# Patient Record
Sex: Female | Born: 1965 | Hispanic: No | Marital: Married | Smoking: Former smoker
Health system: Southern US, Community
[De-identification: ages and names within clinical notes are randomized; demographics above are authoritative.]

## PROBLEM LIST (undated history)

## (undated) DIAGNOSIS — R102 Pelvic and perineal pain: Secondary | ICD-10-CM

## (undated) DIAGNOSIS — N938 Other specified abnormal uterine and vaginal bleeding: Secondary | ICD-10-CM

## (undated) DIAGNOSIS — Z973 Presence of spectacles and contact lenses: Secondary | ICD-10-CM

## (undated) HISTORY — PX: DILATION AND CURETTAGE OF UTERUS: SHX78

## (undated) HISTORY — DX: Other specified abnormal uterine and vaginal bleeding: N93.8

## (undated) HISTORY — PX: ABDOMINAL SURGERY: SHX537

## (undated) HISTORY — PX: TUBAL LIGATION: SHX77

## (undated) HISTORY — DX: Pelvic and perineal pain: R10.2

---

## 2008-06-15 ENCOUNTER — Emergency Department (HOSPITAL_COMMUNITY): Admission: EM | Admit: 2008-06-15 | Discharge: 2008-06-15 | Payer: Self-pay | Admitting: Emergency Medicine

## 2008-06-17 ENCOUNTER — Emergency Department (HOSPITAL_COMMUNITY): Admission: EM | Admit: 2008-06-17 | Discharge: 2008-06-17 | Payer: Self-pay | Admitting: Emergency Medicine

## 2008-07-15 ENCOUNTER — Emergency Department (HOSPITAL_COMMUNITY): Admission: EM | Admit: 2008-07-15 | Discharge: 2008-07-15 | Payer: Self-pay | Admitting: Emergency Medicine

## 2008-07-23 ENCOUNTER — Emergency Department (HOSPITAL_COMMUNITY): Admission: EM | Admit: 2008-07-23 | Discharge: 2008-07-23 | Payer: Self-pay | Admitting: Family Medicine

## 2008-07-23 ENCOUNTER — Ambulatory Visit: Payer: Self-pay | Admitting: Gynecology

## 2008-07-25 HISTORY — PX: ENDOMETRIAL ABLATION: SHX621

## 2008-08-04 ENCOUNTER — Ambulatory Visit: Payer: Self-pay | Admitting: Gynecology

## 2008-08-13 ENCOUNTER — Encounter: Payer: Self-pay | Admitting: Gynecology

## 2008-08-13 ENCOUNTER — Ambulatory Visit: Payer: Self-pay | Admitting: Gynecology

## 2008-08-13 ENCOUNTER — Ambulatory Visit (HOSPITAL_COMMUNITY): Admission: RE | Admit: 2008-08-13 | Discharge: 2008-08-13 | Payer: Self-pay | Admitting: Gynecology

## 2008-08-13 ENCOUNTER — Encounter (INDEPENDENT_AMBULATORY_CARE_PROVIDER_SITE_OTHER): Payer: Self-pay | Admitting: Nurse Practitioner

## 2008-08-13 DIAGNOSIS — Z9889 Other specified postprocedural states: Secondary | ICD-10-CM

## 2008-08-19 ENCOUNTER — Ambulatory Visit: Payer: Self-pay | Admitting: Gynecology

## 2008-08-26 ENCOUNTER — Ambulatory Visit: Payer: Self-pay | Admitting: Gynecology

## 2008-09-15 ENCOUNTER — Ambulatory Visit: Payer: Self-pay | Admitting: Gynecology

## 2008-10-17 ENCOUNTER — Ambulatory Visit: Payer: Self-pay | Admitting: Gynecology

## 2008-10-17 ENCOUNTER — Encounter: Payer: Self-pay | Admitting: Gynecology

## 2008-10-17 ENCOUNTER — Other Ambulatory Visit: Admission: RE | Admit: 2008-10-17 | Discharge: 2008-10-17 | Payer: Self-pay | Admitting: Gynecology

## 2008-12-01 ENCOUNTER — Ambulatory Visit: Payer: Self-pay | Admitting: Nurse Practitioner

## 2008-12-01 DIAGNOSIS — M2550 Pain in unspecified joint: Secondary | ICD-10-CM | POA: Insufficient documentation

## 2008-12-01 DIAGNOSIS — L259 Unspecified contact dermatitis, unspecified cause: Secondary | ICD-10-CM | POA: Insufficient documentation

## 2008-12-01 LAB — CONVERTED CEMR LAB
ALT: 16 units/L (ref 0–35)
AST: 20 units/L (ref 0–37)
Albumin: 4.6 g/dL (ref 3.5–5.2)
Alkaline Phosphatase: 77 units/L (ref 39–117)
Anti Nuclear Antibody(ANA): NEGATIVE
BUN: 7 mg/dL (ref 6–23)
Basophils Absolute: 0 10*3/uL (ref 0.0–0.1)
Basophils Relative: 0 % (ref 0–1)
CO2: 21 meq/L (ref 19–32)
Calcium: 9.9 mg/dL (ref 8.4–10.5)
Chloride: 105 meq/L (ref 96–112)
Creatinine, Ser: 0.56 mg/dL (ref 0.40–1.20)
Eosinophils Absolute: 0.3 10*3/uL (ref 0.0–0.7)
Eosinophils Relative: 3 % (ref 0–5)
Glucose, Bld: 81 mg/dL (ref 70–99)
HCT: 43.1 % (ref 36.0–46.0)
Hemoglobin: 14.7 g/dL (ref 12.0–15.0)
Lymphocytes Relative: 23 % (ref 12–46)
Lymphs Abs: 2.3 10*3/uL (ref 0.7–4.0)
MCHC: 34.1 g/dL (ref 30.0–36.0)
MCV: 87.6 fL (ref 78.0–100.0)
Monocytes Absolute: 0.5 10*3/uL (ref 0.1–1.0)
Monocytes Relative: 5 % (ref 3–12)
Neutro Abs: 6.6 10*3/uL (ref 1.7–7.7)
Neutrophils Relative %: 68 % (ref 43–77)
Platelets: 175 10*3/uL (ref 150–400)
Potassium: 4 meq/L (ref 3.5–5.3)
RBC: 4.92 M/uL (ref 3.87–5.11)
RDW: 12.7 % (ref 11.5–15.5)
Rheumatoid fact SerPl-aCnc: <20 intl units/mL (ref 0–20)
Sed Rate: 8 mm/hr (ref 0–22)
Sodium: 138 meq/L (ref 135–145)
TSH: 1.387 microintl units/mL (ref 0.350–4.500)
Total Bilirubin: 0.6 mg/dL (ref 0.3–1.2)
Total Protein: 7.9 g/dL (ref 6.0–8.3)
Uric Acid, Serum: 3.4 mg/dL (ref 2.4–7.0)
WBC: 9.7 10*3/uL (ref 4.0–10.5)

## 2008-12-08 ENCOUNTER — Ambulatory Visit: Payer: Self-pay | Admitting: Nurse Practitioner

## 2008-12-08 DIAGNOSIS — M545 Low back pain: Secondary | ICD-10-CM

## 2009-01-01 ENCOUNTER — Ambulatory Visit: Payer: Self-pay | Admitting: Nurse Practitioner

## 2009-01-01 ENCOUNTER — Other Ambulatory Visit: Admission: RE | Admit: 2009-01-01 | Discharge: 2009-01-01 | Payer: Self-pay | Admitting: Internal Medicine

## 2009-01-01 ENCOUNTER — Encounter (INDEPENDENT_AMBULATORY_CARE_PROVIDER_SITE_OTHER): Payer: Self-pay | Admitting: Nurse Practitioner

## 2009-01-01 DIAGNOSIS — R5381 Other malaise: Secondary | ICD-10-CM

## 2009-01-01 DIAGNOSIS — R5383 Other fatigue: Secondary | ICD-10-CM

## 2009-01-01 LAB — CONVERTED CEMR LAB
Bilirubin Urine: NEGATIVE
Glucose, Urine, Semiquant: NEGATIVE
KOH Prep: NEGATIVE
Ketones, urine, test strip: NEGATIVE
Nitrite: NEGATIVE
Protein, U semiquant: NEGATIVE
Specific Gravity, Urine: <1.005
Urobilinogen, UA: 0.2
WBC Urine, dipstick: NEGATIVE
pH: 6.5

## 2009-01-02 ENCOUNTER — Encounter (INDEPENDENT_AMBULATORY_CARE_PROVIDER_SITE_OTHER): Payer: Self-pay | Admitting: Nurse Practitioner

## 2009-01-05 ENCOUNTER — Ambulatory Visit (HOSPITAL_COMMUNITY): Admission: RE | Admit: 2009-01-05 | Discharge: 2009-01-05 | Payer: Self-pay | Admitting: Internal Medicine

## 2009-01-06 DIAGNOSIS — E559 Vitamin D deficiency, unspecified: Secondary | ICD-10-CM | POA: Insufficient documentation

## 2009-01-06 LAB — CONVERTED CEMR LAB
Chlamydia, DNA Probe: NEGATIVE
Cholesterol: 206 mg/dL — ABNORMAL HIGH (ref 0–200)
GC Probe Amp, Genital: NEGATIVE
HDL: 48 mg/dL (ref >39)
LDL Cholesterol: 139 mg/dL — ABNORMAL HIGH (ref 0–99)
Total CHOL/HDL Ratio: 4.3
Triglycerides: 95 mg/dL (ref <150)
VLDL: 19 mg/dL (ref 0–40)
Vit D, 25-Hydroxy: 16 ng/mL — ABNORMAL LOW (ref 30–89)

## 2009-01-08 ENCOUNTER — Encounter (INDEPENDENT_AMBULATORY_CARE_PROVIDER_SITE_OTHER): Payer: Self-pay | Admitting: Nurse Practitioner

## 2009-01-29 ENCOUNTER — Ambulatory Visit: Payer: Self-pay | Admitting: Nurse Practitioner

## 2009-01-29 LAB — CONVERTED CEMR LAB
Basophils Absolute: 0 10*3/uL (ref 0.0–0.1)
Basophils Relative: 1 % (ref 0–1)
Eosinophils Absolute: 0.3 10*3/uL (ref 0.0–0.7)
Eosinophils Relative: 5 % (ref 0–5)
HCT: 39.5 % (ref 36.0–46.0)
Hemoglobin: 12.8 g/dL (ref 12.0–15.0)
Lymphocytes Relative: 21 % (ref 12–46)
Lymphs Abs: 1.6 10*3/uL (ref 0.7–4.0)
MCHC: 32.4 g/dL (ref 30.0–36.0)
MCV: 91 fL (ref 78.0–100.0)
Monocytes Absolute: 0.5 10*3/uL (ref 0.1–1.0)
Monocytes Relative: 6 % (ref 3–12)
Neutro Abs: 5.1 10*3/uL (ref 1.7–7.7)
Neutrophils Relative %: 68 % (ref 43–77)
Platelets: 205 10*3/uL (ref 150–400)
RBC: 4.34 M/uL (ref 3.87–5.11)
RDW: 13.2 % (ref 11.5–15.5)
WBC: 7.5 10*3/uL (ref 4.0–10.5)

## 2009-02-10 ENCOUNTER — Encounter (INDEPENDENT_AMBULATORY_CARE_PROVIDER_SITE_OTHER): Payer: Self-pay | Admitting: Nurse Practitioner

## 2009-03-13 ENCOUNTER — Emergency Department (HOSPITAL_COMMUNITY): Admission: EM | Admit: 2009-03-13 | Discharge: 2009-03-13 | Payer: Self-pay | Admitting: Emergency Medicine

## 2009-03-13 ENCOUNTER — Inpatient Hospital Stay (HOSPITAL_COMMUNITY): Admission: AD | Admit: 2009-03-13 | Discharge: 2009-03-14 | Payer: Self-pay | Admitting: Obstetrics & Gynecology

## 2009-03-19 ENCOUNTER — Ambulatory Visit: Payer: Self-pay | Admitting: Obstetrics and Gynecology

## 2009-03-22 ENCOUNTER — Encounter (INDEPENDENT_AMBULATORY_CARE_PROVIDER_SITE_OTHER): Payer: Self-pay | Admitting: Nurse Practitioner

## 2009-04-02 ENCOUNTER — Ambulatory Visit: Payer: Self-pay | Admitting: Obstetrics and Gynecology

## 2009-04-23 ENCOUNTER — Ambulatory Visit: Payer: Self-pay | Admitting: Nurse Practitioner

## 2009-04-23 LAB — CONVERTED CEMR LAB: Vit D, 25-Hydroxy: 62 ng/mL (ref 30–89)

## 2009-04-24 ENCOUNTER — Encounter (INDEPENDENT_AMBULATORY_CARE_PROVIDER_SITE_OTHER): Payer: Self-pay | Admitting: Nurse Practitioner

## 2009-05-06 ENCOUNTER — Ambulatory Visit: Payer: Self-pay | Admitting: Nurse Practitioner

## 2009-05-06 DIAGNOSIS — N946 Dysmenorrhea, unspecified: Secondary | ICD-10-CM | POA: Insufficient documentation

## 2009-05-28 ENCOUNTER — Encounter: Payer: Self-pay | Admitting: Family

## 2009-05-28 ENCOUNTER — Ambulatory Visit: Payer: Self-pay | Admitting: Obstetrics and Gynecology

## 2009-05-28 LAB — CONVERTED CEMR LAB
HCT: 40.9 % (ref 36.0–46.0)
Hemoglobin: 13.5 g/dL (ref 12.0–15.0)
MCHC: 33 g/dL (ref 30.0–36.0)
MCV: 88.5 fL (ref 78.0–100.0)
Platelets: 225 10*3/uL (ref 150–400)
RBC: 4.62 M/uL (ref 3.87–5.11)
RDW: 12.8 % (ref 11.5–15.5)
Trich, Wet Prep: NONE SEEN
WBC: 10.8 10*3/uL — ABNORMAL HIGH (ref 4.0–10.5)
Yeast Wet Prep HPF POC: NONE SEEN

## 2009-06-17 ENCOUNTER — Ambulatory Visit: Payer: Self-pay | Admitting: Nurse Practitioner

## 2009-06-17 DIAGNOSIS — N949 Unspecified condition associated with female genital organs and menstrual cycle: Secondary | ICD-10-CM

## 2009-06-24 ENCOUNTER — Ambulatory Visit: Payer: Self-pay | Admitting: Obstetrics and Gynecology

## 2009-06-24 LAB — CONVERTED CEMR LAB
HCT: 40 % (ref 36.0–46.0)
Hemoglobin: 13.7 g/dL (ref 12.0–15.0)
MCHC: 34.3 g/dL (ref 30.0–36.0)
MCV: 87.1 fL (ref 78.0–100.0)
Platelets: 221 10*3/uL (ref 150–400)
RBC: 4.59 M/uL (ref 3.87–5.11)
RDW: 12.8 % (ref 11.5–15.5)
WBC: 10.4 10*3/uL (ref 4.0–10.5)

## 2009-08-26 ENCOUNTER — Ambulatory Visit: Payer: Self-pay | Admitting: Obstetrics and Gynecology

## 2009-09-17 ENCOUNTER — Ambulatory Visit: Payer: Self-pay | Admitting: Obstetrics and Gynecology

## 2009-09-17 ENCOUNTER — Other Ambulatory Visit: Admission: RE | Admit: 2009-09-17 | Discharge: 2009-09-17 | Payer: Self-pay | Admitting: Obstetrics and Gynecology

## 2009-09-25 ENCOUNTER — Ambulatory Visit: Payer: Self-pay | Admitting: Nurse Practitioner

## 2009-10-08 ENCOUNTER — Emergency Department (HOSPITAL_COMMUNITY): Admission: EM | Admit: 2009-10-08 | Discharge: 2009-10-08 | Payer: Self-pay | Admitting: Family Medicine

## 2009-10-19 ENCOUNTER — Ambulatory Visit: Payer: Self-pay | Admitting: Gynecology

## 2009-10-29 ENCOUNTER — Ambulatory Visit (HOSPITAL_COMMUNITY): Admission: RE | Admit: 2009-10-29 | Discharge: 2009-10-29 | Payer: Self-pay | Admitting: Obstetrics and Gynecology

## 2009-10-29 ENCOUNTER — Ambulatory Visit: Payer: Self-pay | Admitting: Obstetrics and Gynecology

## 2009-11-26 ENCOUNTER — Ambulatory Visit: Payer: Self-pay | Admitting: Obstetrics & Gynecology

## 2010-01-06 ENCOUNTER — Ambulatory Visit (HOSPITAL_COMMUNITY): Admission: RE | Admit: 2010-01-06 | Discharge: 2010-01-06 | Payer: Self-pay | Admitting: Internal Medicine

## 2010-05-10 ENCOUNTER — Inpatient Hospital Stay (HOSPITAL_COMMUNITY): Admission: AD | Admit: 2010-05-10 | Discharge: 2010-05-10 | Payer: Self-pay | Admitting: Obstetrics and Gynecology

## 2010-05-10 ENCOUNTER — Ambulatory Visit: Payer: Self-pay | Admitting: Nurse Practitioner

## 2010-05-10 LAB — CONVERTED CEMR LAB
ALT: 40 units/L
AST: 33 units/L
Albumin: 4.4 g/dL
Alkaline Phosphatase: 92 units/L
BUN: 5 mg/dL
Basophils Absolute: 0 10*3/uL
Basophils Relative: 0 %
CO2: 20 meq/L
Calcium: 10.1 mg/dL
Chloride: 104 meq/L
Creatinine, Ser: 0.63 mg/dL
Eosinophils Absolute: 0 10*3/uL
Eosinophils Relative: 0 %
Glucose, Bld: 89 mg/dL
HCT: 42.6 %
Hemoglobin: 14.6 g/dL
Lymphocytes Relative: 17 %
Lymphs Abs: 1.6 10*3/uL
MCHC: 34.2 g/dL
MCV: 89.1 fL
Monocytes Absolute: 0.4 10*3/uL
Monocytes Relative: 5 %
Neutro Abs: 7.6 10*3/uL
Neutrophils Relative %: 78 %
Platelets: 214 10*3/uL
Potassium: 3.1 meq/L
RBC: 4.78 M/uL
RDW: 12.7 %
Sodium: 135 meq/L
Total Bilirubin: 0.9 mg/dL
Total Protein: 8 g/dL
WBC: 9.7 10*3/uL

## 2010-05-11 ENCOUNTER — Ambulatory Visit: Payer: Self-pay | Admitting: Nurse Practitioner

## 2010-05-11 DIAGNOSIS — M25539 Pain in unspecified wrist: Secondary | ICD-10-CM

## 2010-05-11 DIAGNOSIS — R109 Unspecified abdominal pain: Secondary | ICD-10-CM | POA: Insufficient documentation

## 2010-05-15 ENCOUNTER — Inpatient Hospital Stay (HOSPITAL_COMMUNITY): Admission: AD | Admit: 2010-05-15 | Discharge: 2010-05-15 | Payer: Self-pay | Admitting: Family Medicine

## 2010-05-15 ENCOUNTER — Emergency Department (HOSPITAL_COMMUNITY): Admission: EM | Admit: 2010-05-15 | Discharge: 2010-05-15 | Payer: Self-pay | Admitting: Emergency Medicine

## 2010-06-09 ENCOUNTER — Ambulatory Visit: Payer: Self-pay | Admitting: Nurse Practitioner

## 2010-06-11 ENCOUNTER — Ambulatory Visit (HOSPITAL_COMMUNITY): Admission: RE | Admit: 2010-06-11 | Discharge: 2010-06-11 | Payer: Self-pay | Admitting: Family Medicine

## 2010-06-23 ENCOUNTER — Telehealth (INDEPENDENT_AMBULATORY_CARE_PROVIDER_SITE_OTHER): Payer: Self-pay | Admitting: Nurse Practitioner

## 2010-07-23 ENCOUNTER — Ambulatory Visit: Payer: Self-pay | Admitting: Nurse Practitioner

## 2010-07-23 ENCOUNTER — Ambulatory Visit (HOSPITAL_COMMUNITY)
Admission: RE | Admit: 2010-07-23 | Discharge: 2010-07-23 | Payer: Self-pay | Source: Home / Self Care | Attending: Nurse Practitioner | Admitting: Nurse Practitioner

## 2010-07-23 LAB — CONVERTED CEMR LAB
Bilirubin Urine: NEGATIVE
Glucose, Urine, Semiquant: NEGATIVE
Ketones, urine, test strip: NEGATIVE
Nitrite: NEGATIVE
Protein, U semiquant: NEGATIVE
Specific Gravity, Urine: 1.01
Urobilinogen, UA: 0.2
WBC Urine, dipstick: NEGATIVE
pH: 6.5

## 2010-08-13 ENCOUNTER — Encounter (INDEPENDENT_AMBULATORY_CARE_PROVIDER_SITE_OTHER): Payer: Self-pay | Admitting: Nurse Practitioner

## 2010-08-24 NOTE — Letter (Signed)
Summary: GYN VISIT NOTE  GYN VISIT NOTE   Imported By: Arta Bruce 06/04/2010 14:31:57  _____________________________________________________________________  External Attachment:    Type:   Image     Comment:   External Document

## 2010-08-24 NOTE — Assessment & Plan Note (Signed)
Summary: Right Wrist pain   Vital Signs:  Patient profile:   45 year old female Menstrual status:  regular Weight:      116.9 pounds BMI:     24.10 Temp:     97.8 degrees F oral Pulse rate:   72 / minute Pulse rhythm:   regular Resp:     16 per minute BP sitting:   96 / 70  (left arm) Cuff size:   regular  Vitals Entered By: Levon Hedger (June 09, 2010 3:50 PM) CC: 2 week follow-up on wrist and kidney pain Is Patient Diabetic? No  Does patient need assistance? Functional Status Self care Ambulation Normal   CC:  2 week follow-up on wrist and kidney pain.  History of Present Illness:  Pt into the office for follow up on right wrist pain. Pt seen on 05/11/2010 for right hand pain. She has been wearing splint as given during last visit. she has also been taking ibuprofen and reports that when she takes the medications then the pain is ok but as soon as it wears off then the pain returns.  social - pt is not employed at this time and is unsure what type of work she will be able to do with her wrist pain   Allergies: No Known Drug Allergies  Review of Systems CV:  Denies chest pain or discomfort. Resp:  Denies cough. GI:  Denies abdominal pain, nausea, and vomiting. MS:  Complains of joint pain; right wrist pain.  Physical Exam  General:  alert.   Head:  normocephalic.     Wrist/Hand Exam  Wrist Exam:    Right:    Inspection:  Abnormal       Location:  right radial head    Stability:  stable    Tenderness:  right radial head    Swelling:  no    Erythema:  no   Impression & Recommendations:  Problem # 1:  WRIST PAIN, RIGHT (ICD-719.43) advised pt to wear brace continue to take x-ray may be carpal tunnel Orders: Radiology other (Radiology Other)  Problem # 2:  NEED PROPHYLACTIC VACCINATION&INOCULATION FLU (ICD-V04.81) to be given today in office  Complete Medication List: 1)  Ibuprofen 600 Mg Tabs (Ibuprofen) .... One tablet by mouth two  times a day as needed for pain 2)  Prenatal Plus/iron 27-1 Mg Tabs (Prenatal vit-fe fumarate-fa) .... One tablet by mouth daily 3)  Prednisone (pak) 10 Mg Tabs (Prednisone) .... Take as directed - taper from 60mg  to 0mg  over 7 days  Other Orders: Flu Vaccine 54yrs + (14782) Admin 1st Vaccine (95621)  Patient Instructions: 1)  You have been given the flu vaccine today 2)  Get x-ray of right wrist 3)  Take predisone taper  4)  Thursday - 6 tablets 5)  Friday - 5 tablets  6)  Saturday - 4 tablets 7)  Sunday - 3 tablets 8)  Monday - 2 tablets 9)  Tuesday - 1 tablet 10)  Wednesday - none 11)  Follow up in 2 weeks for wrist pain.  12)  If still present will need referral to hand pecialist. Prescriptions: PREDNISONE (PAK) 10 MG TABS (PREDNISONE) Take as directed - taper from 60mg  to 0mg  over 7 days  #21 x 0   Entered and Authorized by:   Lehman Prom FNP   Signed by:   Lehman Prom FNP on 06/09/2010   Method used:   Print then Give to Patient   RxID:   [id number]  Orders Added: 1)  Est. Patient Level III [28413] 2)  Radiology other [Radiology Other] 3)  Flu Vaccine 61yrs + [90658] 4)  Admin 1st Vaccine [90471]   Immunizations Administered:  Influenza Vaccine # 1:    Vaccine Type: Fluvax 3+    Site: left deltoid    Mfr: GlaxoSmithKline    Dose: 0.5 ml    Route: IM    Given by: Levon Hedger    Exp. Date: 01/22/2011    Lot #: [id number]    VIS given: 02/16/10 version given June 10, 2010.  Flu Vaccine Consent Questions:    Do you have a history of severe allergic reactions to this vaccine? no    Any prior history of allergic reactions to egg and/or gelatin? no    Do you have a sensitivity to the preservative Thimersol? no    Do you have a past history of Guillan-Barre Syndrome? no    Do you currently have an acute febrile illness? no    Have you ever had a severe reaction to latex? no    Vaccine information given and explained to patient? yes    Are  you currently pregnant? no    ndc  [phone]  Immunizations Administered:  Influenza Vaccine # 1:    Vaccine Type: Fluvax 3+    Site: left deltoid    Mfr: GlaxoSmithKline    Dose: 0.5 ml    Route: IM    Given by: Levon Hedger    Exp. Date: 01/22/2011    Lot #: [id number]    VIS given: 02/16/10 version given June 10, 2010.  Prevention & Chronic Care Immunizations   Influenza vaccine: Fluvax 3+  (06/09/2010)    Tetanus booster: 07/30/2008: Historical    Pneumococcal vaccine: Not documented  Other Screening   Pap smear:  Specimen Adequacy: Satisfactory for evaluation.   Interpretation/Result:Negative for intraepithelial Lesion or Malignancy.   Interpretation/Result:Reactive Changes.    (01/01/2009)    Mammogram: ASSESSMENT: Negative - BI-RADS 1^MM DIGITAL SCREENING  (01/06/2010)   Smoking status: smokess tobacco  (01/01/2009)  Lipids   Total Cholesterol: 206  (01/02/2009)   LDL: 139  (01/02/2009)   LDL Direct: Not documented   HDL: 48  (01/02/2009)   Triglycerides: 95  (01/02/2009)   Nursing Instructions: Give Flu vaccine today

## 2010-08-24 NOTE — Assessment & Plan Note (Signed)
Summary: Back Pain   Vital Signs:  Patient profile:   45 year old female Menstrual status:  regular Weight:      121.5 pounds BP sitting:   131 / 87  (left arm) CC: joint pain in the body x a long time and she does not have anymore medicine, Back Pain  Does patient need assistance? Functional Status Self care Ambulation Normal   CC:  joint pain in the body x a long time and she does not have anymore medicine and Back Pain.  History of Present Illness:  Pt into the office with complaints of low back pain Previously prescribed ibuprofen which worked well but pt has completed her supply,. Pain is not daily no radiation of pain into her legs.  limited to lower back  No other acute problems today.  interpreter present with pt present today  Back Pain History:      The patient's back pain has been present for < 6 weeks.  The pain is located in the lower back region and does not radiate below the knees.  She states that she has no prior history of back pain.  The patient has not had any recent physical therapy for her back pain.    Critical Exclusionary Diagnosis Criteria (CEDC) for Back Pain:      The patient denies a history of previous trauma.  She has no prior history of spinal surgery.  There are no symptoms to suggest infection, cancer, cauda equina, or psychosocial factors for back pain.     Allergies (verified): No Known Drug Allergies  Review of Systems General:  Denies fever. CV:  Denies chest pain or discomfort. Resp:  Denies cough. GI:  Denies abdominal pain, nausea, and vomiting. MS:  Complains of low back pain.  Physical Exam  General:  alert.   Head:  normocephalic.   Lungs:  normal breath sounds.   Heart:  normal rate and regular rhythm.     Detailed Back/Spine Exam  Lumbosacral Exam:  Inspection-deformity:    Normal Palpation-spinal tenderness:  Normal   Impression & Recommendations:  Problem # 1:  LOW BACK PAIN, MILD (ICD-724.2) will refill  ibuprofen if pain persists will need additional f/u Her updated medication list for this problem includes:    Ibuprofen 600 Mg Tabs (Ibuprofen) ..... One tablet by mouth two times a day as needed for pain  Complete Medication List: 1)  Hydrocortisone 2.5 % Crea (Hydrocortisone) .... One application topically to affected area two times a day 2)  Ibuprofen 600 Mg Tabs (Ibuprofen) .... One tablet by mouth two times a day as needed for pain 3)  Depo-provera 150 Mg/ml Susp (Medroxyprogesterone acetate) .... Given at gyn 4)  Prenatal Plus/iron 27-1 Mg Tabs (Prenatal vit-fe fumarate-fa) .... One tablet by mouth daily  Patient Instructions: 1)  Wear the brace to affected area during the day. 2)  Remove at night for rest. 3)  Take ibuprofen as needed for pain Prescriptions: IBUPROFEN 600 MG TABS (IBUPROFEN) One tablet by mouth two times a day as needed for pain  #50 x 1   Entered and Authorized by:   Lehman Prom FNP   Signed by:   Lehman Prom FNP on 09/25/2009   Method used:   Print then Give to Patient   RxID:   [id number]

## 2010-08-24 NOTE — Assessment & Plan Note (Signed)
Summary: Right wrist pain   Vital Signs:  Patient profile:   45 year old female Menstrual status:  regular Height:      58.50 inches Weight:      120 pounds BMI:     24.74 Temp:     97.0 degrees F oral Pulse rate:   88 / minute Pulse rhythm:   regular Resp:     16 per minute BP sitting:   110 / 80  (left arm) Cuff size:   regular  Vitals Entered By: [place] Shannon (May 11, 2010 2:08 PM) CC: pt is here for pin in her arm... ptsays brace is not helping Is Patient Diabetic? No Pain Assessment Patient in pain? no       Does patient need assistance? Functional Status Self care Ambulation Normal   CC:  pt is here for pin in her arm... ptsays brace is not helping.  History of Present Illness:  Pt into the office for f/u on wrist pain however she was seen in the ER for abdominal pain.   Pt returned to ER due to extreme pain and she had a procedure done 6 months ago at [facility]. Pain at time of presentation 6/10.  Full ER visit reviewed. labs done - afebrile with normal WBC urine pregnancy negative US transvaginal done which showed small uterine fibriods which are a known dx to pt last menses at least 6 months ago prior to her ablation CT abdomen/pelvic shows noral appendix, gallbladder is incompletely distended, no stones in kidneys, no small bowel obstruction Pt was started on ibuprofen and hydrocodone as needed for pain  Vaginal exam done in the ER and light vaginal bleeding noted  Right wrist pain - ongoing and pt presents today with splint in place. Pain in wrist has gotten progressively worse over time.  Admits that she has done lost of household chores over the past 2-3 weeks preparing for a festival and she thought that is what flared her arm pain. She has soaked her hand in warm salt water to help with pain  Language line used for napali interpreter  Current Medications (verified): 1)  Hydrocortisone 2.5 % Crea (Hydrocortisone) .... One Application Topically  To Affected Area Two Times A Day 2)  Ibuprofen 600 Mg Tabs (Ibuprofen) .... One Tablet By Mouth Two Times A Day As Needed For Pain 3)  Depo-Provera 150 Mg/ml Susp (Medroxyprogesterone Acetate) .... Given At Gyn 4)  Prenatal Plus/iron 27-1 Mg Tabs (Prenatal Vit-Fe Fumarate-Fa) .... One Tablet By Mouth Daily  Allergies (verified): No Known Drug Allergies  Review of Systems CV:  Denies chest pain or discomfort. Resp:  Denies cough. GI:  Complains of abdominal pain; denies nausea and vomiting. MS:  Complains of joint pain.  Physical Exam  General:  alert.   Head:  normocephalic.   Lungs:  normal breath sounds.   Heart:  normal rate and regular rhythm.   Abdomen:  right lower quadrant tenderness with palpation BS x 4 Msk:  wrist splint in place Neurologic:  alert & oriented X3.     Impression & Recommendations:  Problem # 1:  WRIST PAIN, RIGHT (ICD-719.43) brace wrist in place advise pt that she likely overused her hand with all the preparations for festival  Problem # 2:  ABDOMINAL PAIN (ICD-789.00)  ER notes reviewed ? kidney stones - advised pt to drink plenty of water  Orders: Depo- Medrol 80mg  (J1040) Ketorolac-Toradol 15mg  (U4403) Admin of Therapeutic Inj  intramuscular or subcutaneous (47425)  Complete Medication  List: 1)  Hydrocortisone 2.5 % Crea (Hydrocortisone) .... One application topically to affected area two times a day 2)  Ibuprofen 600 Mg Tabs (Ibuprofen) .... One tablet by mouth two times a day as needed for pain 3)  Prenatal Plus/iron 27-1 Mg Tabs (Prenatal vit-fe fumarate-fa) .... One tablet by mouth daily  Patient Instructions: 1)  Emergency room labs and tests reviewed. 2)  Quite possibly that you are passing a kidney stone.  Be sure to drink plenty of water. 3)  Take ibuprofen 600mg  by mouth two times a day with food for the next 5 days. 4)  For extreme pain take the hydrododone/Acetaminophen as ordered in the Emergency room. 5)  Right wrist pain  - most likely due to over use of the right wrist.  You should not lift more than 10 pounds.  Also you should avoid repeatative activities that cause you to overuse your right wrist.  You may need to wear your wrist spling both day and night for the next week until symptoms improve 6)  Decreased appetite - start multivitamin daily.  This can be purchased over the counter.  also be sure to eat green leafy veges. 7)  Follow up with n.martin,fnp in 2 weeks for wrist and abdominal pain. Will need flu vaccine Prescriptions: IBUPROFEN 600 MG TABS (IBUPROFEN) One tablet by mouth two times a day as needed for pain  #50 x 1   Entered and Authorized by:   Lehman Prom FNP   Signed by:   Lehman Prom FNP on 05/11/2010   Method used:   Print then Give to Patient   RxID:   [id number]    Medication Administration  Injection # 1:    Medication: Depo- Medrol 80mg     Diagnosis: ABDOMINAL PAIN (ICD-789.00)    Route: IM    Site: RUOQ gluteus    Exp Date: 10/2012    Lot #: [id number]    Mfr: Pharmacia    Patient tolerated injection without complications    Given by: Levon Hedger (May 11, 2010 3:10 PM)  Injection # 2:    Medication: Ketorolac-Toradol 15mg     Diagnosis: ABDOMINAL PAIN (ICD-789.00)    Route: IM    Site: LUOQ gluteus    Exp Date: 04/2011    Lot #: [id number]    Mfr: Worckhardt    Patient tolerated injection without complications    Given by: Levon Hedger (May 11, 2010 3:10 PM)  Orders Added: 1)  Est. Patient Level III [99213] 2)  Depo- Medrol 80mg  [J1040] 3)  Ketorolac-Toradol 15mg  [J1885] 4)  Admin of Therapeutic Inj  intramuscular or subcutaneous [96372]     Pelvic US  Procedure date:  05/10/2010  Findings:      small uterine fibroids  CT of Abdomen  Procedure date:  05/10/2010  Findings:      no renal or uteteral calculi probable functional follicle adjacent of left ovary   Pelvic US  Procedure date:  05/10/2010  Findings:      small  uterine fibroids  CT of Abdomen  Procedure date:  05/10/2010  Findings:      no renal or uteteral calculi probable functional follicle adjacent of left ovary    Medication Administration  Injection # 1:    Medication: Depo- Medrol 80mg     Diagnosis: ABDOMINAL PAIN (ICD-789.00)    Route: IM    Site: RUOQ gluteus    Exp Date: 10/2012    Lot #: [id number]    Mfr: Pharmacia  Patient tolerated injection without complications    Given by: Levon Hedger (May 11, 2010 3:10 PM)  Injection # 2:    Medication: Ketorolac-Toradol 15mg     Diagnosis: ABDOMINAL PAIN (ICD-789.00)    Route: IM    Site: LUOQ gluteus    Exp Date: 04/2011    Lot #: [id number]    Mfr: Worckhardt    Patient tolerated injection without complications    Given by: Levon Hedger (May 11, 2010 3:10 PM)  Orders Added: 1)  Est. Patient Level III [45409] 2)  Depo- Medrol 80mg  [J1040] 3)  Ketorolac-Toradol 15mg  [J1885] 4)  Admin of Therapeutic Inj  intramuscular or subcutaneous [81191]

## 2010-08-26 NOTE — Assessment & Plan Note (Signed)
Summary: F/u X-ray results   Vital Signs:  Patient profile:   45 year old female Menstrual status:  regular Weight:      117.3 pounds BMI:     24.19 Temp:     97.6 degrees F oral Pulse rate:   72 / minute Pulse rhythm:   regular Resp:     16 per minute BP sitting:   92 / 70  (left arm) Cuff size:   regular  Vitals Entered By: Levon Hedger (July 23, 2010 9:42 AM) CC: follow-up visit on wrist pain....still having wrist pain...Marland KitchenMarland KitchenMarland Kitchenhas been having increasing pain on right side with vomiting Is Patient Diabetic? No Pain Assessment Patient in pain? yes     Location: right side  Does patient need assistance? Functional Status Self care Ambulation Normal   CC:  follow-up visit on wrist pain....still having wrist pain...Marland KitchenMarland KitchenMarland Kitchenhas been having increasing pain on right side with vomiting.  History of Present Illness:  Pt into the office for f/u on right wrist. 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  No acute osseous findings.   Pt was treated with prednisone following her last visit and she reports improved results. She presents today without her brace.  Son present today to interpret - [place] [employer] [phone]  Allergies (verified): No Known Drug Allergies  Review of Systems General:  Complains of loss of appetite; denies fever. GI:  Complains of abdominal pain and vomiting; denies nausea; abd pain started 2 days ago, vomiting x 1 day. she has not yet eaten anything yet today. No fever pain even with walking but pain is intermittent and goes and comes at this time S/p endometrial biopsy over 1 year ago and no menses since that time. no abd surgeries - gallbladder and appendix is still in place. MS:  Complains of joint pain; right wrist - improved.  Physical Exam  General:   alert.   Head:  normocephalic.   Abdomen:  right lower quadrant tenderness - rebound BS x 4 Msk:  up to the exam table Neurologic:  alert & oriented X3.     Impression & Recommendations:  Problem # 1:  WRIST PAIN, RIGHT (ICD-719.43) x-rays showed ? underlying scapholunate ligament tear advised pt to wear brace at night take ibuprofen as needed  would like to refer pt to hand specialist but pt no longer has medicaid  Problem # 2:  ABDOMINAL PAIN (ICD-789.00)  concern for appendicitis will get pt scheduled for a CT to check the appendix  Orders: CT with Contrast (CT w/ contrast) UA Dipstick w/o Micro (manual) (29562)  Complete Medication List: 1)  Ibuprofen 600 Mg Tabs (Ibuprofen) .... One tablet by mouth two times a day as needed for pain 2)  Prenatal Plus/iron 27-1 Mg Tabs (Prenatal vit-fe fumarate-fa) .... One tablet by mouth daily  Patient Instructions: 1)  You should wear your wrist splint at night. 2)  X-rays show that you may have a tear in ligament in your hand which is the likely cause for pain. 3)  Be sure to monitor repeative activity with your right hand. 4)  Go to get the x-ray of your abdomen done today. They will call this office with the results. Prescriptions: IBUPROFEN 600 MG TABS (IBUPROFEN) One tablet by mouth two times a day as needed for pain  #50 x 1   Entered and Authorized by:  Lehman Prom FNP   Signed by:   Lehman Prom FNP on 07/23/2010   Method used:   Print then Give to Patient   RxID:   [id number]    Orders Added: 1)  Est. Patient Level III [64403] 2)  CT with Contrast [CT w/ contrast] 3)  UA Dipstick w/o Micro (manual) [81002]    Laboratory Results   Urine Tests  Date/Time Received: July 23, 2010 10:59 AM   Routine Urinalysis   Color: lt. yellow Glucose: negative   (Normal Range: Negative) Bilirubin: negative   (Normal Range: Negative) Ketone: negative   (Normal Range: Negative) Spec. Gravity: 1.010    (Normal Range: 1.003-1.035) Blood: trace-lysed   (Normal Range: Negative) pH: 6.5   (Normal Range: 5.0-8.0) Protein: negative   (Normal Range: Negative) Urobilinogen: 0.2   (Normal Range: 0-1) Nitrite: negative   (Normal Range: Negative) Leukocyte Esterace: negative   (Normal Range: Negative)

## 2010-08-26 NOTE — Progress Notes (Signed)
Summary: X-ray results needs appt.  Phone Note Outgoing Call   Summary of Call: x-ray results received  Pt has an appt with me the end of december - will need to see her sooner to review x-ray because she will need referral  Initial call taken by: Lehman Prom FNP,  June 23, 2010 1:31 PM  Follow-up for Phone Call        Please schedule. Follow-up by: Gaylyn Cheers RN,  June 23, 2010 5:15 PM  Additional Follow-up for Phone Call Additional follow up Details #1::        CALLED BUT NO ANSWER, SO I LEFT MESSAGE TO CALL OFFICE TO MAKE AN APPT. Additional Follow-up by: Leodis Rains,  June 25, 2010 10:37 AM  New Problems: WRIST PAIN, RIGHT [phone])   Additional Follow-up for Phone Call Additional follow up Details #2::    called and left another  message to call and make appt.Cala Bradford Tinnin  June 30, 2010 12:09 PM   TRIED CALLING AGAIN, BUT NO ANSWER, SO I LEFT ANOTHER MESSAGE TO CALL OFFICE AND PATIENT NEXT APPT IS NEXT WEEK 07/23/10  Follow-up by: Leodis Rains,  July 13, 2010 9:50 AM  New Problems: WRIST PAIN, RIGHT [phone]) Phone Note Outgoing Call   Summary of Call: x-ray results received  Pt has an appt with me the end of december - will need to see her sooner to review x-ray because she will need referral  Initial call taken by: Lehman Prom FNP,  June 23, 2010 1:31 PM  Follow-up for Phone Call        Please schedule. Follow-up by: Gaylyn Cheers RN,  June 23, 2010 5:15 PM  Additional Follow-up for Phone Call Additional follow up Details #1::        CALLED BUT NO ANSWER, SO I LEFT MESSAGE TO CALL OFFICE TO MAKE AN APPT. Additional Follow-up by: Leodis Rains,  June 25, 2010 10:37 AM  New Problems: WRIST PAIN, RIGHT [phone])   Additional Follow-up for Phone Call Additional follow up Details #2::    called and left another  message to call and make appt.Cala Bradford Tinnin  June 30, 2010 12:09 PM   TRIED  CALLING AGAIN, BUT NO ANSWER, SO I LEFT ANOTHER MESSAGE TO CALL OFFICE AND PATIENT NEXT APPT IS NEXT WEEK 07/23/10  Follow-up by: Leodis Rains,  July 13, 2010 9:50 AM  New Problems: WRIST PAIN, RIGHT [phone])

## 2010-08-26 NOTE — Letter (Signed)
Summary: TEST ORDER FORM/CI /APPT DATE * TIME  TEST ORDER FORM/CI /APPT DATE * TIME   Imported By: Arta Bruce 07/27/2010 14:31:54  _____________________________________________________________________  External Attachment:    Type:   Image     Comment:   External Document

## 2010-08-26 NOTE — Letter (Signed)
Summary: Handout Printed  Printed Handout:  - Appendicitis

## 2010-08-26 NOTE — Letter (Signed)
Summary: PT'S SON TO PICK UP HER MEDICAL RECORDS  PT'S SON TO PICK UP HER MEDICAL RECORDS   Imported By: Arta Bruce 08/13/2010 14:23:07  _____________________________________________________________________  External Attachment:    Type:   Image     Comment:   External Document

## 2010-09-14 ENCOUNTER — Encounter (INDEPENDENT_AMBULATORY_CARE_PROVIDER_SITE_OTHER): Payer: Self-pay | Admitting: Nurse Practitioner

## 2010-09-14 ENCOUNTER — Encounter: Payer: Self-pay | Admitting: Nurse Practitioner

## 2010-09-21 NOTE — Letter (Signed)
Summary: Generic Letter  [facility]  [address] [place], [place] [address]   Phone: [phone]  Fax: [phone]    09/14/2010  Arbie Lubas [address] [place], [place]  [address]  To Whom it may concern:  Katherine [name] is an established patient in this office.  She has a history of right hand/thumb tendinitis for which she has been treated since October 2011.  She has been instructed to wear a brace to support her right wrist.  Please review the above and consider when assessing for job activities. Call the office if you have any additional questions.     Sincerely,    Lehman Prom FNP [facility]

## 2010-09-21 NOTE — Assessment & Plan Note (Signed)
Summary: Right Wrist Pain   Vital Signs:  Patient profile:   45 year old female Menstrual status:  regular LMP:     08/26/2010 Weight:      118.9 pounds BMI:     24.52 Temp:     97.4 degrees F oral Pulse rate:   72 / minute Pulse rhythm:   regular Resp:     16 per minute BP sitting:   100 / 80  (left arm) Cuff size:   regular  Vitals Entered By: Levon Hedger (September 14, 2010 12:22 PM) CC: hand pain x 1 /12 year Is Patient Diabetic? No Pain Assessment Patient in pain? yes     Location: hand  Does patient need assistance? Functional Status Self care Ambulation Normal LMP (date): 08/26/2010 LMP - Character: heavy     Menstrual flow (days): 10 Enter LMP: 08/26/2010 Last PAP Result  Specimen Adequacy: Satisfactory for evaluation.   Interpretation/Result:Negative for intraepithelial Lesion or Malignancy.   Interpretation/Result:Reactive Changes.     CC:  hand pain x 1 /12 year.  History of Present Illness:  Pt into the office still with complaints of her right hand. She has been seen in the office several times with similar complaints. Pt is employed at [employer] This is a company that [employer].  She started working there in December. He had has started swelling more frequently since she has started working. She presents today with a brace in place. Pt reports that she has to work to support her and her family. She does take ibuprofen as needed for the pain and swelling.  Language line used for interpreter  Allergies (verified): No Known Drug Allergies  Review of Systems CV:  Denies chest pain or discomfort. Resp:  Denies cough. MS:  Complains of muscle; right hand pain.  Physical Exam  General:  alert.   Head:  normocephalic.   Msk:  right hand splint in place Neurologic:  alert & oriented X3.   Psych:  Oriented X3.     Impression & Recommendations:  Problem # 1:  WRIST PAIN, RIGHT (ICD-719.43) This is a chronic problem for the pt work  may exacerbate the problem but I understand that pt needs to work to survive will refill ibuprofen keep wearing the splint the right hand  Complete Medication List: 1)  Ibuprofen 600 Mg Tabs (Ibuprofen) .... One tablet by mouth two times a day as needed for pain 2)  Prenatal Plus/iron 27-1 Mg Tabs (Prenatal vit-fe fumarate-fa) .... One tablet by mouth daily  Patient Instructions: 1)  Buy some over the counter vitamins - (Women's One A Day) to help with appetite 2)  Take note to your job 3)  Follow up here as needed Prescriptions: IBUPROFEN 600 MG TABS (IBUPROFEN) One tablet by mouth two times a day as needed for pain  #50 x 1   Entered and Authorized by:   Lehman Prom FNP   Signed by:   Lehman Prom FNP on 09/14/2010   Method used:   Print then Give to Patient   RxID:   [id number]    Orders Added: 1)  Est. Patient Level III [66063]

## 2010-10-06 LAB — DIFFERENTIAL
Basophils Absolute: 0 10*3/uL (ref 0.0–0.1)
Basophils Absolute: 0 10*3/uL (ref 0.0–0.1)
Basophils Relative: 0 % (ref 0–1)
Basophils Relative: 0 % (ref 0–1)
Eosinophils Absolute: 0 10*3/uL (ref 0.0–0.7)
Eosinophils Absolute: 0.1 10*3/uL (ref 0.0–0.7)
Eosinophils Relative: 0 % (ref 0–5)
Eosinophils Relative: 1 % (ref 0–5)
Lymphocytes Relative: 18 % (ref 12–46)
Lymphs Abs: 2.2 10*3/uL (ref 0.7–4.0)
Monocytes Absolute: 0.4 10*3/uL (ref 0.1–1.0)
Monocytes Absolute: 0.5 10*3/uL (ref 0.1–1.0)
Monocytes Relative: 4 % (ref 3–12)
Neutro Abs: 9.6 10*3/uL — ABNORMAL HIGH (ref 1.7–7.7)
Neutrophils Relative %: 77 % (ref 43–77)

## 2010-10-06 LAB — URINALYSIS, ROUTINE W REFLEX MICROSCOPIC
Bilirubin Urine: NEGATIVE
Glucose, UA: NEGATIVE mg/dL
Glucose, UA: NEGATIVE mg/dL
Hgb urine dipstick: NEGATIVE
Ketones, ur: NEGATIVE mg/dL
Protein, ur: NEGATIVE mg/dL
Specific Gravity, Urine: <1.005 — ABNORMAL LOW (ref 1.005–1.030)
Urobilinogen, UA: 0.2 mg/dL (ref 0.0–1.0)
pH: 6.5 (ref 5.0–8.0)

## 2010-10-06 LAB — CBC
HCT: 41.4 % (ref 36.0–46.0)
Hemoglobin: 14.5 g/dL (ref 12.0–15.0)
Hemoglobin: 14.6 g/dL (ref 12.0–15.0)
MCH: 30.5 pg (ref 26.0–34.0)
MCH: 30.5 pg (ref 26.0–34.0)
MCHC: 35 g/dL (ref 30.0–36.0)
MCV: 87.2 fL (ref 78.0–100.0)
Platelets: 203 10*3/uL (ref 150–400)
Platelets: 214 10*3/uL (ref 150–400)
RBC: 4.75 MIL/uL (ref 3.87–5.11)
RBC: 4.78 MIL/uL (ref 3.87–5.11)
RDW: 12.7 % (ref 11.5–15.5)
WBC: 12.4 10*3/uL — ABNORMAL HIGH (ref 4.0–10.5)
WBC: 9.7 10*3/uL (ref 4.0–10.5)

## 2010-10-06 LAB — WET PREP, GENITAL
Clue Cells Wet Prep HPF POC: NONE SEEN
Trich, Wet Prep: NONE SEEN

## 2010-10-06 LAB — COMPREHENSIVE METABOLIC PANEL
ALT: 40 U/L — ABNORMAL HIGH (ref 0–35)
ALT: 73 U/L — ABNORMAL HIGH (ref 0–35)
AST: 33 U/L (ref 0–37)
AST: 53 U/L — ABNORMAL HIGH (ref 0–37)
Albumin: 4.1 g/dL (ref 3.5–5.2)
Albumin: 4.4 g/dL (ref 3.5–5.2)
Alkaline Phosphatase: 92 U/L (ref 39–117)
Alkaline Phosphatase: 94 U/L (ref 39–117)
BUN: 8 mg/dL (ref 6–23)
CO2: 20 mEq/L (ref 19–32)
CO2: 22 mEq/L (ref 19–32)
Calcium: 10.1 mg/dL (ref 8.4–10.5)
Chloride: 104 mEq/L (ref 96–112)
Chloride: 106 mEq/L (ref 96–112)
Creatinine, Ser: 0.63 mg/dL (ref 0.4–1.2)
Creatinine, Ser: 0.69 mg/dL (ref 0.4–1.2)
GFR calc Af Amer: >60 mL/min (ref >60)
GFR calc Af Amer: >60 mL/min (ref >60)
GFR calc non Af Amer: >60 mL/min (ref >60)
GFR calc non Af Amer: >60 mL/min (ref >60)
Glucose, Bld: 84 mg/dL (ref 70–99)
Potassium: 3.1 mEq/L — ABNORMAL LOW (ref 3.5–5.1)
Potassium: 3.8 mEq/L (ref 3.5–5.1)
Sodium: 135 mEq/L (ref 135–145)
Sodium: 139 mEq/L (ref 135–145)
Total Bilirubin: 0.9 mg/dL (ref 0.3–1.2)
Total Bilirubin: 1.3 mg/dL — ABNORMAL HIGH (ref 0.3–1.2)
Total Protein: 7.5 g/dL (ref 6.0–8.3)

## 2010-10-06 LAB — POCT I-STAT, CHEM 8
BUN: 9 mg/dL (ref 6–23)
Chloride: 106 mEq/L (ref 96–112)
HCT: 43 % (ref 36.0–46.0)
Sodium: 139 mEq/L (ref 135–145)
TCO2: 22 mmol/L (ref 0–100)

## 2010-10-06 LAB — GC/CHLAMYDIA PROBE AMP, GENITAL
Chlamydia, DNA Probe: NEGATIVE
GC Probe Amp, Genital: NEGATIVE

## 2010-10-06 LAB — SAMPLE TO BLOOD BANK

## 2010-10-06 LAB — URINE MICROSCOPIC-ADD ON

## 2010-10-06 LAB — LIPASE, BLOOD: Lipase: 22 U/L (ref 11–59)

## 2010-10-13 LAB — CBC
HCT: 40 % (ref 36.0–46.0)
Hemoglobin: 13.6 g/dL (ref 12.0–15.0)
MCV: 90.6 fL (ref 78.0–100.0)
Platelets: 220 10*3/uL (ref 150–400)
RDW: 13.1 % (ref 11.5–15.5)
WBC: 8.5 10*3/uL (ref 4.0–10.5)

## 2010-10-30 LAB — CBC
Hemoglobin: 12.7 g/dL (ref 12.0–15.0)
MCHC: 33.2 g/dL (ref 30.0–36.0)
MCV: 91.1 fL (ref 78.0–100.0)
RBC: 4.19 MIL/uL (ref 3.87–5.11)
RDW: 12.8 % (ref 11.5–15.5)

## 2010-11-08 LAB — HCG, SERUM, QUALITATIVE: Preg, Serum: NEGATIVE

## 2010-11-08 LAB — URINALYSIS, ROUTINE W REFLEX MICROSCOPIC
Bilirubin Urine: NEGATIVE
Ketones, ur: NEGATIVE mg/dL
Nitrite: NEGATIVE
Protein, ur: NEGATIVE mg/dL
Urobilinogen, UA: 0.2 mg/dL (ref 0.0–1.0)

## 2010-11-08 LAB — CBC
HCT: 37 % (ref 36.0–46.0)
MCV: 88.1 fL (ref 78.0–100.0)
Platelets: 243 10*3/uL (ref 150–400)
RDW: 14.2 % (ref 11.5–15.5)

## 2010-12-07 NOTE — Op Note (Signed)
[name], [name]               ACCOUNT NO.:  [id number]   MEDICAL RECORD NO.:  [id number]          PATIENT TYPE:  AMB   LOCATION:  SDC                           FACILITY:  [facility]   PHYSICIAN:  Juan H. Lily Peer, M.D.DATE OF BIRTH:  04-21-1966   DATE OF PROCEDURE:  DATE OF DISCHARGE:                               OPERATIVE REPORT   SURGEON:  Juan H. Lily Peer, MD   INDICATIONS FOR OPERATION:  A 45 year old, gravida 5, para 3, AB2, with  dysfunctional bleeding and submucous myoma.   Preoperative diagnoses demonstrated a benign endometrial biopsy and a  submucous myoma in the posterior aspect of the lower uterine segment.   PATHOLOGY REPORT:  Endometrial biopsy demonstrated benign secretory  endometrium.  No hyperplasia or atypia or malignancy identified.   PREOPERATIVE DIAGNOSES:  1. Menometrorrhagia.  2. Submucous myoma.   POSTOPERATIVE DIAGNOSES:  1. Menometrorrhagia.  2. Submucous myoma.   ANESTHESIA:  General endotracheal anesthesia.   PROCEDURE PERFORMED:  1. diagnostic hysteroscopy.  2. Resectoscope myomectomy.  3. D and C.  4. Ablation of submucous myoma base.   FINDINGS:  Submucous myoma measuring 2 x 3 cm in the posterior aspect of  the lower uterine segment.  Both tubal ostia were identified.  No other  intrauterine abnormality were noted.   DESCRIPTION OF OPERATION:  After the patient was adequately counseled,  she was taken to the operating room where she underwent a successful  general endotracheal anesthesia.  She had received a gram of cefotetan  prophylactically and had PSA stockings for DVT prophylaxis.  She was  placed in the low lithotomy position, and the abdomen and vagina and  perineum were prepped and draped in the usual sterile fashion.  A red  rubber Roxan Hockey was inserted to evacuate the bladder of its contents for  approximately 50 mL.  The uterus felt to be slightly retroverted.  There  were no palpable adnexal masses.  A weighted speculum  was placed in the  posterior vaginal vault.  A Sims retractor was placed in the anterior  vaginal wall.  A single-tooth tenaculum was placed on the anterior  cervical lip.  The cervix was serially dilated with a Pratt dilator in  an effort to allow the insertion of a Karl-Storz operative resectoscope  with a 90 degrees wire loop inserted into the intrauterine cavity.  Sorbitol 3% was the distending media, and the Valleylab electrical  surgical generator was utilized to resect the myoma.  The settings were  on 79 W on cutting and 80 on coagulation.  After systematic inspection  of the intrauterine cavity, the submucous myoma which measured 2 x 3 cm  in size was shaved off all the way to its base, and following this a  vigorous curettage was performed.  Then, a suction curette was inserted  to remove all the fragments of the submucous myoma as well as another  additional curettings and was sent off for histological evaluation.  The  working element was then switched from 90-degree wire loop to the  VaporTrode and the setting was set at 150 W on the cutting  mode, and the  base of the myoma in the posterior uterus was ablated for 2-3 mm to  contain some of the bleeding.  The patient tolerated the procedure well.  The single-tooth tenaculum was removed.  There was a small laceration  from the tenaculum in the anterior cervical lip which was secured with a  running stitch of 0 Vicryl suture.  Sponge count and needle count were  correct.  The patient was extubated, transferred to recovery room with  stable vital signs.  Blood loss was minimal.  Fluid resuscitation  consisted of 1200 mL of lactated Ringer, and she received Toradol 30 mg  IV en route to the recovery room, and 3% sorbitol fluid deficit was 200  mL.      Juan H. Lily Peer, M.D.  Electronically Signed     JHF/MEDQ  D:  08/13/2008  T:  08/13/2008  Job:  [id number]

## 2010-12-07 NOTE — H&P (Signed)
Katherine [name], Katherine [name]               ACCOUNT NO.:  [id number]   MEDICAL RECORD NO.:  [id number]         PATIENT TYPE:  DSU   LOCATION:                                [facility]  [facility]   PHYSICIAN:  Juan H. Lily Peer, M.D.DATE OF BIRTH:  1966-06-27   DATE OF ADMISSION:  08/13/2008  DATE OF DISCHARGE:                              HISTORY & PHYSICAL    The patient is scheduled for surgery on Wednesday, January 20 at 8:30  a.m. at [facility].  Please have history and physical  available.   HISTORY:  The patient is a 45 year old gravida 5, para 3, AB 2 who is  from the nation a [place], but lived 18 years in [place] has been having  approximately 14-year history of dysfunctional uterine bleeding in her  country.  She had been placed on oral contraceptive pill for 3 years 14  years ago.  She has had a prior tubal sterilization proceeded, no other  workup was done.  The patient was referred to our practice from the  [facility] in [place]  and was seen in our office on December the  30th.  She had an endometrial biopsy because of her dysfunctional  uterine bleeding which demonstrated degenerating secretory type  endometrium with no hyperplasia, atypia, or malignancy, and she was also  suffering from deficiency anemia where she was supplemented with iron  and her hemoglobin/hematocrit in the office on January 11th was 12.6 and  37% respectively with normal platelet count.  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  The right ovary was thin wall echo-free cyst  measuring 24 x 24 x 12 mm, left ovary normal.  There was some small  amount of fluid in the cul-de-sac.  The sonohysterogram demonstrated  echogenic focus measuring 12 x 9 mm in the intrauterine cavity and  synechia measured  7 mm.  The patient is scheduled to undergo  resectoscope polypectomy and literature information was provided.  She  did not show up as previously recommended, the day prior of her surgery  for placement of laminaria intracervically.   PAST MEDICAL HISTORY:  The patient's children were all delivered  vaginally.  She had a postpartum tubal ligation.  She denies any  allergies.  Speaks very little [place].  It was through the use of an  interpreter.   PHYSICAL EXAMINATION:  VITAL SIGNS: The patient weighs 95 pounds.  She  is 4 feet and 1-1/2 inch tall.  Blood pressure 116/72.  HEENT: Unremarkable.  NECK: Supple.  Trachea midline.  No carotid bruits.  No thyromegaly.  LUNGS: Clear to auscultation without rhonchi or wheezes.  HEART: Regular rate and rhythm.  No murmurs or gallop.  BREAST EXAM:  Not done.  PELVIC:  Bartholin, urethra, Skene's within normal limits.  Vagina and  cervix no gross lesions on inspection.  Uterus slightly retroverted.  No  palpable mass or tenderness.  RECTAL EXAM:  Deferred.   ASSESSMENT:  A 45 year old gravida 5, para 3, AB 2 with several-year  history of dysfunctional bleeding with minimal evaluation in her  country.  She underwent extensive evaluation and is consisting of a  sonohysterogram demonstrated an endometrial polyp with a dimension of  12.9 mm and synechia in the uterus, and also her endometrial biopsy  demonstrated degenerating secretory type endometrium.  No hyperplasia,  atypia, or malignancy.  She has had a prior tubal sterilization  procedure in the past.  She had had an abdominopelvic CT scan that was  done in the emergency room at [facility] as a result of abdominopelvic  discomfort that she was having.  She will undergo resectoscopic  polypectomy.  The risks, benefits, and pros and cons were discussed, and  possibly ablate some at the base of the polyp to help contain some of  the bleeding she had.  Her last hemoglobin/hematocrit in the office  was  12.6 and 37% respectively.  Through the use of interpreter, the risks,  benefits, and pros and cons of procedure discussed.  All questions were  answered, and will follow accordingly.      Juan H. Lily Peer, M.D.  Electronically Signed     JHF/MEDQ  D:  08/12/2008  T:  08/13/2008  Job:  [id number]

## 2011-01-31 ENCOUNTER — Other Ambulatory Visit (HOSPITAL_COMMUNITY): Payer: Self-pay | Admitting: Family Medicine

## 2011-01-31 DIAGNOSIS — Z1231 Encounter for screening mammogram for malignant neoplasm of breast: Secondary | ICD-10-CM

## 2011-02-09 ENCOUNTER — Ambulatory Visit (HOSPITAL_COMMUNITY)
Admission: RE | Admit: 2011-02-09 | Discharge: 2011-02-09 | Disposition: A | Discharge status: Home / Self Care | Payer: Self-pay | Source: Ambulatory Visit | Attending: Family Medicine | Admitting: Family Medicine

## 2011-02-09 DIAGNOSIS — Z1231 Encounter for screening mammogram for malignant neoplasm of breast: Secondary | ICD-10-CM | POA: Insufficient documentation

## 2011-04-27 LAB — COMPREHENSIVE METABOLIC PANEL
ALT: 17
AST: 26
Albumin: 3.9
CO2: 25
Calcium: 9.7
Creatinine, Ser: 0.52
GFR calc Af Amer: >60
Sodium: 137
Total Protein: 7.3

## 2011-04-27 LAB — WET PREP, GENITAL
Clue Cells Wet Prep HPF POC: NONE SEEN
Trich, Wet Prep: NONE SEEN
WBC, Wet Prep HPF POC: NONE SEEN

## 2011-04-27 LAB — CBC
HCT: 39.8
Hemoglobin: 13.1
MCHC: 33.3
MCV: 88
MCV: 88.5
Platelets: 204
Platelets: 206
RBC: 4.5
RDW: 15.2
RDW: 15.4
WBC: 11 — ABNORMAL HIGH

## 2011-04-27 LAB — POCT I-STAT, CHEM 8
Calcium, Ion: 1.35 — ABNORMAL HIGH
Creatinine, Ser: 0.8
Hemoglobin: 13.6
Sodium: 138
TCO2: 26

## 2011-04-27 LAB — URINALYSIS, ROUTINE W REFLEX MICROSCOPIC
Bilirubin Urine: NEGATIVE
Glucose, UA: NEGATIVE
Hgb urine dipstick: NEGATIVE
Hgb urine dipstick: NEGATIVE
Nitrite: NEGATIVE
Specific Gravity, Urine: 1.01
Specific Gravity, Urine: 1.01
Urobilinogen, UA: 0.2
Urobilinogen, UA: 0.2
pH: 7.5
pH: 7.5

## 2011-04-27 LAB — DIFFERENTIAL
Eosinophils Absolute: 0.2
Eosinophils Absolute: 0.3
Eosinophils Relative: 3
Eosinophils Relative: 3
Lymphocytes Relative: 20
Lymphs Abs: 1.3
Lymphs Abs: 1.6
Monocytes Absolute: 0.6
Monocytes Relative: 7

## 2011-04-27 LAB — URINE CULTURE: Culture: NO GROWTH

## 2011-04-27 LAB — GC/CHLAMYDIA PROBE AMP, GENITAL: Chlamydia, DNA Probe: NEGATIVE

## 2011-04-27 LAB — LIPASE, BLOOD: Lipase: 25

## 2011-04-29 LAB — POCT I-STAT, CHEM 8
Chloride: 104 mEq/L (ref 96–112)
HCT: 37 % (ref 36.0–46.0)
Hemoglobin: 12.6 g/dL (ref 12.0–15.0)
Potassium: 3.9 mEq/L (ref 3.5–5.1)
Sodium: 139 mEq/L (ref 135–145)

## 2011-12-19 ENCOUNTER — Encounter (HOSPITAL_COMMUNITY): Payer: Self-pay

## 2011-12-19 ENCOUNTER — Encounter (HOSPITAL_COMMUNITY): Payer: Self-pay | Admitting: Emergency Medicine

## 2011-12-19 ENCOUNTER — Emergency Department (HOSPITAL_COMMUNITY)
Admission: EM | Admit: 2011-12-19 | Discharge: 2011-12-19 | Disposition: A | Discharge status: Home / Self Care | Payer: PRIVATE HEALTH INSURANCE | Attending: Emergency Medicine | Admitting: Emergency Medicine

## 2011-12-19 ENCOUNTER — Emergency Department (HOSPITAL_COMMUNITY): Payer: PRIVATE HEALTH INSURANCE

## 2011-12-19 ENCOUNTER — Emergency Department (INDEPENDENT_AMBULATORY_CARE_PROVIDER_SITE_OTHER)
Admission: EM | Admit: 2011-12-19 | Discharge: 2011-12-19 | Disposition: A | Discharge status: Other Acute Inpatient Hospital | Payer: PRIVATE HEALTH INSURANCE | Source: Home / Self Care | Attending: Family Medicine | Admitting: Family Medicine

## 2011-12-19 DIAGNOSIS — M549 Dorsalgia, unspecified: Secondary | ICD-10-CM

## 2011-12-19 DIAGNOSIS — X58XXXA Exposure to other specified factors, initial encounter: Secondary | ICD-10-CM | POA: Insufficient documentation

## 2011-12-19 DIAGNOSIS — Y92009 Unspecified place in unspecified non-institutional (private) residence as the place of occurrence of the external cause: Secondary | ICD-10-CM | POA: Insufficient documentation

## 2011-12-19 DIAGNOSIS — S335XXA Sprain of ligaments of lumbar spine, initial encounter: Secondary | ICD-10-CM | POA: Insufficient documentation

## 2011-12-19 DIAGNOSIS — S39012A Strain of muscle, fascia and tendon of lower back, initial encounter: Secondary | ICD-10-CM

## 2011-12-19 MED ORDER — KETOROLAC TROMETHAMINE 60 MG/2ML IM SOLN
60.0000 mg | Freq: Once | INTRAMUSCULAR | Status: AC
  Administered 2011-12-19: 60 mg via INTRAMUSCULAR
  Filled 2011-12-19: qty 2

## 2011-12-19 MED ORDER — CYCLOBENZAPRINE HCL 10 MG PO TABS: 10.0000 mg | ORAL_TABLET | Freq: Two times a day (BID) | ORAL | Status: AC | PRN

## 2011-12-19 MED ORDER — CYCLOBENZAPRINE HCL 10 MG PO TABS
10.0000 mg | ORAL_TABLET | Freq: Once | ORAL | Status: AC
  Administered 2011-12-19: 10 mg via ORAL
  Filled 2011-12-19: qty 1

## 2011-12-19 MED ORDER — MELOXICAM 7.5 MG PO TABS: 7.5000 mg | ORAL_TABLET | Freq: Every day | ORAL | Status: DC

## 2011-12-19 NOTE — ED Provider Notes (Addendum)
History   This chart was scribed for Gwyneth Sprout, MD by Brooks Sailors. The patient was seen in room STRE1/STRE1. Patient's care was started at 1348.   CSN: [id number]  Arrival date & time 12/19/11  1348   None     Chief Complaint  Patient presents with  . Back Pain    (Consider location/radiation/quality/duration/timing/severity/associated sxs/prior treatment) HPI  Katherine [name] is a 46 y.o. female who presents to the Emergency Department complaining of moderate to severe lower back pain onset two days ago and persistent since. Pt was sitting on sofa and pain started up when patient stood. Pt's husband says pain is relieved by laying down and aggravated by sitting and standing. Pt says she has had this pain before 20 years ago when she fell. Denies abdominal pain and radiation of pain, weakness.    History reviewed. No pertinent past medical history.  History reviewed. No pertinent past surgical history.  History reviewed. No pertinent family history.  History  Substance Use Topics  . Smoking status: Not on file  . Smokeless tobacco: Not on file  . Alcohol Use: Not on file    OB History    Grav Para Term Preterm Abortions TAB SAB Ect Mult Living                  Review of Systems  Musculoskeletal: Positive for back pain.  All other systems reviewed and are negative.    Allergies  Review of patient's allergies indicates no known allergies.  Home Medications  No current outpatient prescriptions on file.  BP 101/66  Pulse 61  Temp(Src) 97.7 F (36.5 C) (Oral)  Resp 20  SpO2 99%  Physical Exam  Nursing note and vitals reviewed. Constitutional: She is oriented to person, place, and time. She appears well-developed and well-nourished. No distress.  HENT:  Head: Normocephalic and atraumatic.  Eyes: EOM are normal.  Neck: Neck supple. No tracheal deviation present.  Cardiovascular: Normal rate.   Pulmonary/Chest: Effort normal. No respiratory distress.    Abdominal: Soft. There is no tenderness.  Musculoskeletal: Normal range of motion. She exhibits tenderness.       Positive straight leg raise bilaterally, Normal strength, good pulses, tenderness with palpitation lower lumber, tenderness with moment Decreased ROM.   Neurological: She is alert and oriented to person, place, and time.  Skin: Skin is warm and dry.  Psychiatric: She has a normal mood and affect. Her behavior is normal.    ED Course  Procedures (including critical care time)  Pt seen at 1443. To have x-ray and pain medication for back   Labs Reviewed - No data to display Dg Lumbar Spine Complete  12/19/2011  *RADIOLOGY REPORT*  Clinical Data: Acute on chronic back pain  LUMBAR SPINE - COMPLETE 4+ VIEW  Comparison: CT abdomen pelvis dated 07/23/2010  Findings: Normal lumbar lordosis.  No evidence of fracture or dislocation.  Vertebral body heights and intervertebral disc spaces are maintained.  Very mild degenerative changes at T12-L1 and L2-3.  The visualized bony pelvis appears intact.  IMPRESSION: No fracture or dislocation is seen.  Very mild degenerative changes.  Original Report Authenticated By: Charline Bills, M.D.     1. Lumbar strain       MDM   Pt with onset of back pain suggestive of muscle spasm which started when she got up from the sofa.  Pt has had issues in the past intermittently but they usually go away.  No neurovascular compromise and no  incontinence.  Pt has no infectious sx, hx of CA  or other red flags concerning for pathologic back pain.  Pt is able to ambulate but is painful.  Normal strength and reflexes on exam.  Denies trauma.  Lumbar films neg.  Denies any urinary complaints. Will give pt pain control and to return for developement of above sx.  3:54 PM Pt feeling better after pain control    I personally performed the services described in this documentation, which was scribed in my presence.  The recorded information has been reviewed  and considered.    Gwyneth Sprout, MD 12/19/11 1536  Gwyneth Sprout, MD 12/19/11 1554  Gwyneth Sprout, MD 12/19/11 1557

## 2011-12-19 NOTE — ED Notes (Signed)
Reportedly has been having pain in low back since weekend; significant discomfort w movement, walking

## 2011-12-19 NOTE — ED Provider Notes (Signed)
History     CSN: [id number]  Arrival date & time 12/19/11  1030   First MD Initiated Contact with Patient 12/19/11 1030      Chief Complaint  Patient presents with  . Back Pain    (Consider location/radiation/quality/duration/timing/severity/associated sxs/prior treatment) Patient is a 46 y.o. female presenting with back pain. The history is provided by the patient.  Back Pain  This is a new problem. The current episode started 2 days ago. The problem occurs constantly. The problem has been gradually worsening. The pain is associated with no known injury. The pain is present in the lumbar spine. The pain does not radiate. The pain is moderate. The symptoms are aggravated by bending. Pertinent negatives include no chest pain, no abdominal pain, no bowel incontinence, no pelvic pain, no leg pain, no paresthesias, no tingling and no weakness.    History reviewed. No pertinent past medical history.  History reviewed. No pertinent past surgical history.  History reviewed. No pertinent family history.  History  Substance Use Topics  . Smoking status: Not on file  . Smokeless tobacco: Not on file  . Alcohol Use: Not on file    OB History    Grav Para Term Preterm Abortions TAB SAB Ect Mult Living                  Review of Systems  Constitutional: Negative.   Cardiovascular: Negative for chest pain.  Gastrointestinal: Negative.  Negative for abdominal pain and bowel incontinence.  Genitourinary: Negative.  Negative for pelvic pain.  Musculoskeletal: Positive for back pain and gait problem.  Neurological: Negative for tingling, weakness and paresthesias.    Allergies  Review of patient's allergies indicates not on file.  Home Medications  No current outpatient prescriptions on file.  BP 125/84  Pulse 67  Temp(Src) 97.9 F (36.6 C) (Oral)  Resp 18  SpO2 99%  Physical Exam  Nursing note and vitals reviewed. Constitutional: She is oriented to person, place, and  time. She appears well-developed and well-nourished.  Pulmonary/Chest: Breath sounds normal.  Abdominal: Soft. Bowel sounds are normal. There is no tenderness.  Musculoskeletal: She exhibits tenderness.       Acute lower lumbar pain, unable to ambulate without assistance, able to stand, neg slr.pulse 2+.  Neurological: She is alert and oriented to person, place, and time.  Skin: Skin is warm and dry.    ED Course  Procedures (including critical care time)  Labs Reviewed - No data to display No results found.   1. Back pain, acute       MDM          Linna Hoff, MD 12/19/11 1256

## 2011-12-19 NOTE — ED Notes (Signed)
Patient in xray 

## 2011-12-19 NOTE — ED Notes (Signed)
Pt c/o lower back pain x 2days; pt sent from [facility] due to not being able to ambulate without assistance; pt denies obvious injury

## 2012-11-08 ENCOUNTER — Emergency Department (HOSPITAL_COMMUNITY)
Admission: EM | Admit: 2012-11-08 | Discharge: 2012-11-08 | Disposition: A | Discharge status: Home / Self Care | Payer: PRIVATE HEALTH INSURANCE | Attending: Emergency Medicine | Admitting: Emergency Medicine

## 2012-11-08 ENCOUNTER — Emergency Department (HOSPITAL_COMMUNITY): Payer: PRIVATE HEALTH INSURANCE

## 2012-11-08 ENCOUNTER — Encounter (HOSPITAL_COMMUNITY): Payer: Self-pay

## 2012-11-08 DIAGNOSIS — M62830 Muscle spasm of back: Secondary | ICD-10-CM

## 2012-11-08 DIAGNOSIS — R0602 Shortness of breath: Secondary | ICD-10-CM | POA: Insufficient documentation

## 2012-11-08 DIAGNOSIS — M5380 Other specified dorsopathies, site unspecified: Secondary | ICD-10-CM | POA: Insufficient documentation

## 2012-11-08 LAB — COMPREHENSIVE METABOLIC PANEL
ALT: 44 U/L — ABNORMAL HIGH (ref 0–35)
Alkaline Phosphatase: 84 U/L (ref 39–117)
CO2: 24 mEq/L (ref 19–32)
Chloride: 104 mEq/L (ref 96–112)
GFR calc Af Amer: >90 mL/min (ref >90)
GFR calc non Af Amer: >90 mL/min (ref >90)
Glucose, Bld: 94 mg/dL (ref 70–99)
Potassium: 3.6 mEq/L (ref 3.5–5.1)
Sodium: 137 mEq/L (ref 135–145)
Total Bilirubin: 0.8 mg/dL (ref 0.3–1.2)
Total Protein: 7.4 g/dL (ref 6.0–8.3)

## 2012-11-08 LAB — CBC WITH DIFFERENTIAL/PLATELET
Hemoglobin: 12.7 g/dL (ref 12.0–15.0)
Lymphocytes Relative: 19 % (ref 12–46)
Lymphs Abs: 2 10*3/uL (ref 0.7–4.0)
Monocytes Relative: 5 % (ref 3–12)
Neutro Abs: 7.7 10*3/uL (ref 1.7–7.7)
Neutrophils Relative %: 74 % (ref 43–77)
Platelets: 192 10*3/uL (ref 150–400)
RBC: 4.23 MIL/uL (ref 3.87–5.11)
WBC: 10.4 10*3/uL (ref 4.0–10.5)

## 2012-11-08 LAB — POCT I-STAT, CHEM 8
Creatinine, Ser: 0.5 mg/dL (ref 0.50–1.10)
Glucose, Bld: 94 mg/dL (ref 70–99)
Hemoglobin: 12.6 g/dL (ref 12.0–15.0)
Potassium: 3.6 mEq/L (ref 3.5–5.1)

## 2012-11-08 LAB — URINALYSIS, ROUTINE W REFLEX MICROSCOPIC
Bilirubin Urine: NEGATIVE
Hgb urine dipstick: NEGATIVE
Specific Gravity, Urine: 1.006 (ref 1.005–1.030)
Urobilinogen, UA: 0.2 mg/dL (ref 0.0–1.0)

## 2012-11-08 MED ORDER — ONDANSETRON HCL 4 MG/2ML IJ SOLN
4.0000 mg | Freq: Once | INTRAMUSCULAR | Status: AC
  Administered 2012-11-08: 4 mg via INTRAVENOUS
  Filled 2012-11-08: qty 2

## 2012-11-08 MED ORDER — IOHEXOL 350 MG/ML SOLN
100.0000 mL | Freq: Once | INTRAVENOUS | Status: AC | PRN
  Administered 2012-11-08: 100 mL via INTRAVENOUS

## 2012-11-08 MED ORDER — IBUPROFEN 800 MG PO TABS: 800.0000 mg | ORAL_TABLET | Freq: Three times a day (TID) | ORAL | Status: DC

## 2012-11-08 MED ORDER — DIAZEPAM 5 MG PO TABS: 5.0000 mg | ORAL_TABLET | Freq: Two times a day (BID) | ORAL | Status: DC

## 2012-11-08 MED ORDER — HYDROCODONE-ACETAMINOPHEN 5-325 MG PO TABS: 2.0000 | ORAL_TABLET | ORAL | Status: DC | PRN

## 2012-11-08 MED ORDER — SODIUM CHLORIDE 0.9 % IV BOLUS (SEPSIS)
1000.0000 mL | Freq: Once | INTRAVENOUS | Status: AC
  Administered 2012-11-08: 1000 mL via INTRAVENOUS

## 2012-11-08 MED ORDER — MORPHINE SULFATE 4 MG/ML IJ SOLN
4.0000 mg | Freq: Once | INTRAMUSCULAR | Status: AC
  Administered 2012-11-08: 4 mg via INTRAVENOUS
  Filled 2012-11-08: qty 1

## 2012-11-08 NOTE — ED Notes (Signed)
Pt transported to and from radiology on stretcher with tech, tolerated well.  Pt reports (via niece as interpreter) that she has had pain relief.

## 2012-11-08 NOTE — ED Notes (Signed)
Pt presents via EMS from work.  Pt reports midscapular pain that began suddenly while at work. +shortness of breath

## 2012-11-08 NOTE — ED Notes (Signed)
To ct

## 2012-11-08 NOTE — ED Provider Notes (Addendum)
History     CSN: [id number]  Arrival date & time 11/08/12  1346   None     No chief complaint on file.   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 She endorses shortness of breath. She speaks Nepali only.  History obtained through a phone translator. She's never had pain like this in the past. She's had previous issues of low back pain but none at this time. She reports no other medical problems. She had 2 GYN surgeries 2 years ago for bleeding. She denies any fever, cough, congestion, sore throat, chest pain. Denies any trauma. No abdominal pain low back pain. Pain stays between shoulder blades and does not radiate.  The history is provided by the patient. The history is limited by a language barrier. A language interpreter was used.    History reviewed. No pertinent past medical history.  Past Surgical History  Procedure Laterality Date  . Abdominal surgery      History reviewed. No pertinent family history.  History  Substance Use Topics  . Smoking status: Never Smoker   . Smokeless tobacco: Not on file  . Alcohol Use: No    OB History   Grav Para Term Preterm Abortions TAB SAB Ect Mult Living                  Review of Systems  Constitutional: Negative for activity change and appetite change.  HENT: Negative for congestion and rhinorrhea.   Respiratory: Positive for shortness of breath. Negative for cough and chest tightness.   Cardiovascular: Negative for chest pain.  Gastrointestinal: Negative for nausea, vomiting and abdominal pain.  Genitourinary: Negative for dysuria and hematuria.  Musculoskeletal: Positive for back pain.  Skin: Negative for rash.  Neurological: Negative for dizziness, weakness and headaches.  A complete 10 system review of systems was obtained and all systems are negative except as  noted in the HPI and PMH.    Allergies  Review of patient's allergies indicates no known allergies.  Home Medications   Current Outpatient Rx  Name  Route  Sig  Dispense  Refill  . diazepam (VALIUM) 5 MG tablet   Oral   Take 1 tablet (5 mg total) by mouth 2 (two) times daily.   5 tablet   0   . HYDROcodone-acetaminophen (NORCO/VICODIN) 5-325 MG per tablet   Oral   Take 2 tablets by mouth every 4 (four) hours as needed for pain.   10 tablet   0   . ibuprofen (ADVIL,MOTRIN) 800 MG tablet   Oral   Take 1 tablet (800 mg total) by mouth 3 (three) times daily.   21 tablet   0     BP 109/68  Pulse 88  Temp(Src) 98.2 F (36.8 C) (Oral)  Resp 18  Ht 5' (1.524 m)  Wt 130 lb (58.968 kg)  BMI 25.39 kg/m2  SpO2 98%  Physical Exam  Constitutional: She is oriented to person, place, and time. She appears well-developed and well-nourished. She appears distressed.  uncomfortable  HENT:  Head: Normocephalic and atraumatic.  Mouth/Throat: Oropharynx is clear and moist. No oropharyngeal exudate.  Eyes: Conjunctivae and EOM are normal. Pupils are equal, round, and reactive to light.  Neck: Normal range of motion. Neck supple.  Cardiovascular: Normal rate, normal heart sounds and intact distal pulses.   No murmur heard.  equal DP, PT, femoral, radial pulses  Pulmonary/Chest: Effort  normal and breath sounds normal. No respiratory distress.  Abdominal: Soft. There is no tenderness. There is no rebound and no guarding.  Musculoskeletal: Normal range of motion. She exhibits tenderness. She exhibits no edema.  Right-sided paraspinal thoracic tenderness between the shoulder blades  Neurological: She is alert and oriented to person, place, and time. No cranial nerve deficit. She exhibits normal muscle tone. Coordination normal.  Equal grip strengths bilaterally.  Skin: Skin is warm.    ED Course  Procedures (including critical care time)  Labs Reviewed  CBC WITH DIFFERENTIAL -  Abnormal; Notable for the following:    HCT 35.1 (*)    MCHC 36.2 (*)    All other components within normal limits  COMPREHENSIVE METABOLIC PANEL - Abnormal; Notable for the following:    ALT 44 (*)    All other components within normal limits  POCT I-STAT, CHEM 8 - Abnormal; Notable for the following:    Calcium, Ion 1.31 (*)    All other components within normal limits  D-DIMER, QUANTITATIVE  TROPONIN I  URINALYSIS, ROUTINE W REFLEX MICROSCOPIC  POCT I-STAT TROPONIN I   Dg Chest 2 View  11/08/2012  *RADIOLOGY REPORT*  Clinical Data: Chest and upper back pain  CHEST - 2 VIEW  Comparison: Chest x-ray of 05/15/2010  Findings: No active infiltrate or effusion is seen.  There is some peribronchial thickening which may indicate bronchitis. Mediastinal contours are stable.  The heart is within normal limits in size.  No acute bony abnormality is seen.  IMPRESSION: No active lung disease.  Question bronchitis.   Original Report Authenticated By: Dwyane Dee, M.D.    Ct Angio Chest Pe W/cm &/or Wo Cm  11/08/2012  *RADIOLOGY REPORT*  Clinical Data: Back pain, shortness of breath.  CT ANGIOGRAPHY CHEST  Technique:  Multidetector CT imaging of the chest using the standard protocol during bolus administration of intravenous contrast. Multiplanar reconstructed images including MIPs were obtained and reviewed to evaluate the vascular anatomy.  Contrast: OMNIPAQUE IOHEXOL 350 MG/ML SOLN  Comparison: None.  Findings: No filling defects in the pulmonary arteries to suggest pulmonary emboli. Heart is normal size. Aorta is normal caliber. No mediastinal, hilar, or axillary adenopathy.  Minimal ground-glass and linear densities posteriorly in the lungs, likely dependent atelectasis.  Otherwise no confluent opacities. No effusions.  Visualized thyroid and chest wall soft tissues unremarkable. Imaging into the upper abdomen shows no acute findings.  No acute bony abnormality.  IMPRESSION: No evidence of pulmonary  embolus.  No acute findings.   Original Report Authenticated By: Charlett Nose, M.D.      1. Back spasm       MDM  Acute onset of upper back pain between scapulae.  Anxious appearing with SOB. Vitals stable, lungs clear. Pain reproducible to palpation. Consider MI, PE, dissection, PTX, PNA.  EKG nsr.  CXR negative for pneumothorax.  No neuro deficits.  Doubt dissection.  Equal BP's bilaterally. Pain seems to be musculoskeletal. Troponin and D-dimer negative. Pain resolved after one dose of meds.  Resting comfortably. No evidence of PTX, PNA, PE, MI, dissection.  Suspect muscular spasm as source of pain. Will treat with NSAIDs and antiinflammatories. Followup with PCP.    Date: 11/08/2012  Rate: 78  Rhythm: normal sinus rhythm  QRS Axis: normal  Intervals: normal  ST/T Wave abnormalities: normal  Conduction Disutrbances:none  Narrative Interpretation:   Old EKG Reviewed: none available      EMERGENCY DEPARTMENT [place] CARDIAC EXAM "Study: Limited Ultrasound of  the heart and pericardium"  INDICATIONS:Dyspnea Multiple views of the heart and pericardium are obtained with a multi-frequency probe.  PERFORMED ZO:XWRUEA  IMAGES ARCHIVED?: Yes  FINDINGS: No pericardial effusion, Normal contractility and Tamponade physiology absent  LIMITATIONS:  Body habitus and Emergent procedure  VIEWS USED: Subcostal 4 chamber and Apical 4 chamber   INTERPRETATION: Cardiac activity present, Pericardial effusioin absent, Cardiac tamponade absent and Normal contractility  COMMENT:  NO effusion, No R heart strain    Glynn Octave, MD 11/08/12 1832  Glynn Octave, MD 11/13/12 [id number]

## 2012-11-08 NOTE — ED Notes (Signed)
The pt  Appears to be comfortable she speaks little or no  [place].  Family at the bedside and one speaks a little more english than the pt.  Lower back pain.  Waiting for c-t.  The pt is very apprehensive

## 2013-03-22 ENCOUNTER — Ambulatory Visit (INDEPENDENT_AMBULATORY_CARE_PROVIDER_SITE_OTHER): Payer: BC Managed Care – PPO | Admitting: Medical

## 2013-03-22 ENCOUNTER — Encounter: Payer: Self-pay | Admitting: Medical

## 2013-03-22 VITALS — BP 122/82 | HR 72 | Temp 97.9°F | Resp 16 | Ht 59.0 in | Wt 126.0 lb

## 2013-03-22 DIAGNOSIS — M79641 Pain in right hand: Secondary | ICD-10-CM

## 2013-03-22 DIAGNOSIS — K219 Gastro-esophageal reflux disease without esophagitis: Secondary | ICD-10-CM

## 2013-03-22 DIAGNOSIS — M79609 Pain in unspecified limb: Secondary | ICD-10-CM

## 2013-03-22 NOTE — Patient Instructions (Addendum)
Your wrist pain and exam findings suggests a ganglion cyst of the right wrist.   Your 2011 xray suggests this as well.  For now use Aleve OTC once to twice daily for pain.  Use wrist splint, and when ready, call back for referral to hand surgeon.   Ganglion Cyst A ganglion cyst is a noncancerous, fluid-filled lump that occurs near joints or tendons. The ganglion cyst grows out of a joint or the lining of a tendon. It most often develops in the hand or wrist but can also develop in the shoulder, elbow, hip, knee, ankle, or foot. The round or oval ganglion can be pea sized or larger than a grape. Increased activity may enlarge the size of the cyst because more fluid starts to build up.  CAUSES  It is not completely known what causes a ganglion cyst to grow. However, it may be related to:  Inflammation or irritation around the joint.  An injury.  Repetitive movements or overuse.  Arthritis. SYMPTOMS  A lump most often appears in the hand or wrist, but can occur in other areas of the body. Generally, the lump is painless without other symptoms. However, sometimes pain can be felt during activity or when pressure is applied to the lump. The lump may even be tender to the touch. Tingling, pain, numbness, or muscle weakness can occur if the ganglion cyst presses on a nerve. Your grip may be weak and you may have less movement in your joints.  DIAGNOSIS  Ganglion cysts are most often diagnosed based on a physical exam, noting where the cyst is and how it looks. Your caregiver will feel the lump and may shine a light alongside it. If it is a ganglion, a light often shines through it. Your caregiver may order an X-ray, ultrasound, or MRI to rule out other conditions. TREATMENT  Ganglions usually go away on their own without treatment. If pain or other symptoms are involved, treatment may be needed. Treatment is also needed if the ganglion limits your movement or if it gets infected. Treatment options  include:  Wearing a wrist or finger brace or splint.  Taking anti-inflammatory medicine.  Draining fluid from the lump with a needle (aspiration).  Injecting a steroid into the joint.  Surgery to remove the ganglion cyst and its stalk that is attached to the joint or tendon. However, ganglion cysts can grow back. HOME CARE INSTRUCTIONS   Do not press on the ganglion, poke it with a needle, or hit it with a heavy object. You may rub the lump gently and often. Sometimes fluid moves out of the cyst.  Only take medicines as directed by your caregiver.  Wear your brace or splint as directed by your caregiver. SEEK MEDICAL CARE IF:   Your ganglion becomes larger or more painful.  You have increased redness, red streaks, or swelling.  You have pus coming from the lump.  You have weakness or numbness in the affected area. MAKE SURE YOU:   Understand these instructions.  Will watch your condition.  Will get help right away if you are not doing well or get worse. Document Released: 07/08/2000 Document Revised: 04/04/2012 Document Reviewed: 09/04/2007 [facility] Patient Information 2014 [place], [place].     For acid reflux, begin Zantac 75mg  or 150mg  twice daily, OTC.   Gastroesophageal Reflux Disease, Adult Gastroesophageal reflux disease (GERD) happens when acid from your stomach flows up into the esophagus. When acid comes in contact with the esophagus, the acid causes soreness (  inflammation) in the esophagus. Over time, GERD may create small holes (ulcers) in the lining of the esophagus. CAUSES   Increased body weight. This puts pressure on the stomach, making acid rise from the stomach into the esophagus.   Smoking. This increases acid production in the stomach.   Drinking alcohol. This causes decreased pressure in the lower esophageal sphincter (valve or ring of muscle between the esophagus and stomach), allowing acid from the stomach into the esophagus.   Late evening  meals and a full stomach. This increases pressure and acid production in the stomach.   A malformed lower esophageal sphincter.  Sometimes, no cause is found. SYMPTOMS   Burning pain in the lower part of the mid-chest behind the breastbone and in the mid-stomach area. This may occur twice a week or more often.   Trouble swallowing.   Sore throat.   Dry cough.   Asthma-like symptoms including chest tightness, shortness of breath, or wheezing.  DIAGNOSIS  Your caregiver may be able to diagnose GERD based on your symptoms. In some cases, X-rays and other tests may be done to check for complications or to check the condition of your stomach and esophagus. TREATMENT  Your caregiver may recommend over-the-counter or prescription medicines to help decrease acid production. Ask your caregiver before starting or adding any new medicines.  HOME CARE INSTRUCTIONS   Change the factors that you can control. Ask your caregiver for guidance concerning weight loss, quitting smoking, and alcohol consumption.   Avoid foods and drinks that make your symptoms worse, such as:   Caffeine or alcoholic drinks.   Chocolate.   Peppermint or mint flavorings.   Garlic and onions.   Spicy foods.   Citrus fruits, such as oranges, lemons, or limes.   Tomato-based foods such as sauce, chili, salsa, and pizza.   Fried and fatty foods.   Avoid lying down for the 3 hours prior to your bedtime or prior to taking a nap.   Eat small, frequent meals instead of large meals.   Wear loose-fitting clothing. Do not wear anything tight around your waist that causes pressure on your stomach.   Raise the head of your bed 6 to 8 inches with wood blocks to help you sleep. Extra pillows will not help.   Only take over-the-counter or prescription medicines for pain, discomfort, or fever as directed by your caregiver.   Do not take aspirin, ibuprofen, or other nonsteroidal anti-inflammatory drugs (NSAIDs).  SEEK  IMMEDIATE MEDICAL CARE IF:   You have pain in your arms, neck, jaw, teeth, or back.   Your pain increases or changes in intensity or duration.   You develop nausea, vomiting, or sweating (diaphoresis).   You develop shortness of breath, or you faint.   Your vomit is green, yellow, black, or looks like coffee grounds or blood.   Your stool is red, bloody, or black.  These symptoms could be signs of other problems, such as heart disease, gastric bleeding, or esophageal bleeding. MAKE SURE YOU:   Understand these instructions.   Will watch your condition.   Will get help right away if you are not doing well or get worse.  Document Released: 04/20/2005 Document Revised: 03/23/2011 Document Reviewed: 01/28/2011 [facility] Patient Information 2012 [place], [place].

## 2013-03-22 NOTE — Progress Notes (Signed)
Subjective: Here as a new patient.   From [place] originally.   Doesn't speak [place].  She is here with her husband who also presents as a new patient.  Interpreter is present today.   She has c/o chronic right wrist/hand pain.  She is right handed.  Has a chronic pain all the time 24/7 of right wrist.  Had a wood particle lodged in her hand years ago that she took out herself.  Has always wondered if this has caused some chronic hand pain.   She came here to [place] in 2010.  Did see doctor for this at one point. Was advised to get xray.  Had xray 2011.  Wrist pain is worsened with wrist ROM, has problems lifting things.  Has to use scissors and iron at work often and this aggravates the pain.  Resting helps.  No other aggravates or relieves the pain.   No other c/o.  Objective:  Gen: wd, wn,nad Skin: unremarkable MSK: right dorsal wrist centrally over scapholunate region with round somewhat bony abnormality with wrist flexed that is tender, pain with wrist ROM in general, othewrise hands and fingers nontender with normal ROM.  Rest of UE unremarkable Pulses and cap refill normal of hands Neuro intact of hand/arm   Xray wrist 06/11/2010 Intraosseous cystic changes at the scapholunate articulation  suspicious for intraosseous ganglion formation, possibly related to  underlying scapholunate ligament tear. No acute osseous findings  Assessment: Encounter Diagnoses  Name Primary?  . Hand pain, right Yes  . GERD (gastroesophageal reflux disease)     Plan: Likely ganglion cyst of right wrist, particularly given 2011 findings on xray in EMR.  Discussed diagnosis, possible treatments.  She is awaiting insurance verification, has applied.  I recommended for now to use Aleve once to twice daily, wrist splint QHS, ice prn, and as soon as she is ready, we will refer to hand specialist.     GERD - avoid triggers, begin OTC Zantac

## 2013-05-01 ENCOUNTER — Telehealth: Payer: Self-pay | Admitting: Family Medicine

## 2013-05-01 ENCOUNTER — Encounter: Payer: Self-pay | Admitting: Medical

## 2013-05-01 ENCOUNTER — Ambulatory Visit (INDEPENDENT_AMBULATORY_CARE_PROVIDER_SITE_OTHER): Payer: BC Managed Care – PPO | Admitting: Medical

## 2013-05-01 VITALS — BP 110/80 | HR 60 | Temp 97.9°F | Resp 16 | Wt 124.0 lb

## 2013-05-01 DIAGNOSIS — M25531 Pain in right wrist: Secondary | ICD-10-CM

## 2013-05-01 DIAGNOSIS — M67431 Ganglion, right wrist: Secondary | ICD-10-CM

## 2013-05-01 DIAGNOSIS — M25539 Pain in unspecified wrist: Secondary | ICD-10-CM

## 2013-05-01 DIAGNOSIS — M6740 Ganglion, unspecified site: Secondary | ICD-10-CM

## 2013-05-01 NOTE — Telephone Encounter (Signed)
Patient is aware of her appointment to see Dr. Merlyn Lot on 05-03-13 @ 930 am. CLS [phone]

## 2013-05-01 NOTE — Progress Notes (Signed)
Subjective: Here to follow up on right wrist pain.  From [place] originally.   Doesn't speak [place].  She is here with interpreter.  She has c/o chronic right wrist/hand pain.  She is right handed.  Has a chronic pain all the time 24/7 of right wrist.  Had a wood particle lodged in her hand years ago that she took out herself.  Has always wondered if this has caused some chronic hand pain.   She came here to [place] in 2010.  Did see doctor for this at one point. Was advised to get xray.  Had xray 2011.  Wrist pain is worsened with wrist ROM, has problems lifting things.  Has to use scissors and iron at work often and this aggravates the pain.  Resting helps.  No other aggravates or relieves the pain.   No other c/o.  Objective:  Gen: wd, wn,nad Skin: unremarkable MSK: right dorsal wrist centrally over scapholunate region with round somewhat hard abnormality with wrist flexed that is tender, pain with wrist ROM in general, otherwise hands and fingers nontender with normal ROM.  Rest of UE unremarkable Pulses and cap refill normal of hands Neuro intact of hand/arm   Xray wrist 06/11/2010 Intraosseous cystic changes at the scapholunate articulation  suspicious for intraosseous ganglion formation, possibly related to  underlying scapholunate ligament tear. No acute osseous findings  Assessment: Encounter Diagnoses  Name Primary?  . Right wrist pain Yes  . Ganglion cyst of wrist, right     Plan: Likely ganglion cyst of right wrist, particularly given 2011 findings on xray in EMR.  Discussed diagnosis, possible treatments.  She has been having some relief with Aleve and reinforced wrist splint QHS, but given the long term nature of her pains, she is ready for referral to hand specialist for surgical treatment.

## 2013-05-01 NOTE — Telephone Encounter (Signed)
Patient has an appointment on 05/03/13 @ 930 am with Dr. Merlyn Lot. CLS

## 2013-05-01 NOTE — Telephone Encounter (Signed)
Message copied by Janeice Robinson on Wed May 01, 2013 11:13 AM ------      Message from: Aleen Campi, DAVID S      Created: Wed May 01, 2013  8:47 AM       Refer to hand surgeon ASAP.  Send OV notes and xray report from 2011. ------

## 2013-05-21 ENCOUNTER — Other Ambulatory Visit: Payer: Self-pay | Admitting: Orthopedic Surgery

## 2013-05-22 NOTE — Progress Notes (Signed)
Interpretor sch per clinical social-requested durga

## 2013-05-23 ENCOUNTER — Encounter (HOSPITAL_BASED_OUTPATIENT_CLINIC_OR_DEPARTMENT_OTHER): Payer: Self-pay | Admitting: *Deleted

## 2013-05-28 ENCOUNTER — Ambulatory Visit (HOSPITAL_BASED_OUTPATIENT_CLINIC_OR_DEPARTMENT_OTHER)
Admission: RE | Admit: 2013-05-28 | Payer: PRIVATE HEALTH INSURANCE | Source: Ambulatory Visit | Admitting: Orthopedic Surgery

## 2013-05-28 ENCOUNTER — Encounter (HOSPITAL_BASED_OUTPATIENT_CLINIC_OR_DEPARTMENT_OTHER): Admission: RE | Payer: Self-pay | Source: Ambulatory Visit

## 2013-05-28 HISTORY — DX: Presence of spectacles and contact lenses: Z97.3

## 2013-05-28 SURGERY — WRIST ARTHROSCOPY WITH DEBRIDEMENT
Anesthesia: Choice | Laterality: Right

## 2013-06-05 ENCOUNTER — Ambulatory Visit (INDEPENDENT_AMBULATORY_CARE_PROVIDER_SITE_OTHER): Payer: BC Managed Care – PPO | Admitting: Medical

## 2013-06-05 ENCOUNTER — Encounter: Payer: Self-pay | Admitting: Medical

## 2013-06-05 VITALS — BP 102/80 | HR 62 | Temp 98.0°F | Resp 16 | Wt 122.0 lb

## 2013-06-05 DIAGNOSIS — M6740 Ganglion, unspecified site: Secondary | ICD-10-CM

## 2013-06-05 DIAGNOSIS — M67431 Ganglion, right wrist: Secondary | ICD-10-CM

## 2013-06-05 DIAGNOSIS — S638X1A Sprain of other part of right wrist and hand, initial encounter: Secondary | ICD-10-CM

## 2013-06-05 DIAGNOSIS — M25539 Pain in unspecified wrist: Secondary | ICD-10-CM

## 2013-06-05 DIAGNOSIS — G8929 Other chronic pain: Secondary | ICD-10-CM

## 2013-06-05 MED ORDER — IBUPROFEN 600 MG PO TABS: ORAL_TABLET | ORAL | Status: DC

## 2013-06-05 NOTE — Progress Notes (Addendum)
Subjective: Here to follow up on right wrist pain.  From [place] originally.   Doesn't speak [place].  She is here with interpreter.    Since last visit saw hand surgery center.  Was going to have to pay over $3500 out of pocket up front and can't do that.  She at least wants to get refill on pain medication she was using.  She did have MRI of hand through hand surgeon.  She has c/o chronic right wrist/hand pain.  She is right handed.  Has a chronic pain all the time 24/7 of right wrist.  Had a wood particle lodged in her hand years ago that she took out herself.  Has always wondered if this has caused some chronic hand pain.   She came here to [place] in 2010.  Did see doctor for this at one point. Was advised to get xray.  Had xray 2011.  Wrist pain is worsened with wrist ROM, has problems lifting things.  Has to use scissors and iron at work often and this aggravates the pain.  Resting helps.  No other aggravates or relieves the pain.   No other c/o.  Objective:  Gen: wd, wn,nad Skin: unremarkable MSK: right dorsal wrist centrally over scapholunate region with round somewhat hard abnormality with wrist flexed that is tender, pain with wrist ROM in general, otherwise hands and fingers nontender with normal ROM.  Rest of UE unremarkable Pulses and cap refill normal of hands Neuro intact of hand/arm   Xray wrist 06/11/2010 Intraosseous cystic changes at the scapholunate articulation  suspicious for intraosseous ganglion formation, possibly related to  underlying scapholunate ligament tear. No acute osseous findings  MR right wrist 05/10/13 Impression: widening scapholunate articulation with degenerative changes and two cysts measuring 6mm x 13mm.   Findings compatible with tear of scapholunate ligament.  No bony abnormalities.    Assessment: Encounter Diagnoses  Name Primary?  . Ganglion cyst of wrist, right Yes  . Wrist pain, chronic, right   . Tear of right scapholunate ligament, initial  encounter     Plan: Ganglion cyst of right wrist, tear of right scapholunate ligament.  Discussed diagnosis, possible treatments.  Refilled Ibuprofen.   Use splint.   She at this time declines referral to different orthopedist given financial hardship.

## 2013-06-05 NOTE — Addendum Note (Signed)
Addended by: Jac Canavan on: 06/05/2013 09:54 AM   Modules accepted: Orders

## 2013-06-08 ENCOUNTER — Ambulatory Visit: Payer: PRIVATE HEALTH INSURANCE | Attending: Internal Medicine

## 2013-06-12 ENCOUNTER — Encounter: Payer: Self-pay | Admitting: Medical

## 2013-09-22 ENCOUNTER — Encounter (HOSPITAL_COMMUNITY): Payer: Self-pay | Admitting: Emergency Medicine

## 2013-09-22 ENCOUNTER — Emergency Department (HOSPITAL_COMMUNITY): Payer: No Typology Code available for payment source

## 2013-09-22 ENCOUNTER — Emergency Department (HOSPITAL_COMMUNITY)
Admission: EM | Admit: 2013-09-22 | Discharge: 2013-09-22 | Disposition: A | Discharge status: Home / Self Care | Payer: No Typology Code available for payment source | Attending: Emergency Medicine | Admitting: Emergency Medicine

## 2013-09-22 DIAGNOSIS — N925 Other specified irregular menstruation: Secondary | ICD-10-CM | POA: Insufficient documentation

## 2013-09-22 DIAGNOSIS — Z79899 Other long term (current) drug therapy: Secondary | ICD-10-CM | POA: Insufficient documentation

## 2013-09-22 DIAGNOSIS — Z9889 Other specified postprocedural states: Secondary | ICD-10-CM | POA: Insufficient documentation

## 2013-09-22 DIAGNOSIS — N939 Abnormal uterine and vaginal bleeding, unspecified: Secondary | ICD-10-CM

## 2013-09-22 DIAGNOSIS — N949 Unspecified condition associated with female genital organs and menstrual cycle: Secondary | ICD-10-CM | POA: Insufficient documentation

## 2013-09-22 DIAGNOSIS — F17200 Nicotine dependence, unspecified, uncomplicated: Secondary | ICD-10-CM | POA: Insufficient documentation

## 2013-09-22 DIAGNOSIS — N938 Other specified abnormal uterine and vaginal bleeding: Secondary | ICD-10-CM | POA: Insufficient documentation

## 2013-09-22 LAB — URINE MICROSCOPIC-ADD ON

## 2013-09-22 LAB — URINALYSIS, ROUTINE W REFLEX MICROSCOPIC
BILIRUBIN URINE: NEGATIVE
Glucose, UA: NEGATIVE mg/dL
Ketones, ur: NEGATIVE mg/dL
Leukocytes, UA: NEGATIVE
NITRITE: NEGATIVE
PROTEIN: NEGATIVE mg/dL
Specific Gravity, Urine: 1.006 (ref 1.005–1.030)
UROBILINOGEN UA: 0.2 mg/dL (ref 0.0–1.0)
pH: 7 (ref 5.0–8.0)

## 2013-09-22 LAB — CBC
HCT: 39.9 % (ref 36.0–46.0)
Hemoglobin: 13.7 g/dL (ref 12.0–15.0)
MCH: 30.6 pg (ref 26.0–34.0)
MCHC: 34.3 g/dL (ref 30.0–36.0)
MCV: 89.1 fL (ref 78.0–100.0)
PLATELETS: 180 10*3/uL (ref 150–400)
RBC: 4.48 MIL/uL (ref 3.87–5.11)
RDW: 13.1 % (ref 11.5–15.5)
WBC: 8.2 10*3/uL (ref 4.0–10.5)

## 2013-09-22 LAB — COMPREHENSIVE METABOLIC PANEL
ALBUMIN: 3.7 g/dL (ref 3.5–5.2)
ALT: 23 U/L (ref 0–35)
AST: 21 U/L (ref 0–37)
Alkaline Phosphatase: 108 U/L (ref 39–117)
BUN: 5 mg/dL — ABNORMAL LOW (ref 6–23)
CALCIUM: 9.6 mg/dL (ref 8.4–10.5)
CO2: 24 mEq/L (ref 19–32)
CREATININE: 0.58 mg/dL (ref 0.50–1.10)
Chloride: 104 mEq/L (ref 96–112)
Glucose, Bld: 93 mg/dL (ref 70–99)
Potassium: 4.1 mEq/L (ref 3.7–5.3)
SODIUM: 140 meq/L (ref 137–147)
TOTAL PROTEIN: 7.1 g/dL (ref 6.0–8.3)
Total Bilirubin: 0.8 mg/dL (ref 0.3–1.2)

## 2013-09-22 LAB — WET PREP, GENITAL
Clue Cells Wet Prep HPF POC: NONE SEEN
Trich, Wet Prep: NONE SEEN
WBC, Wet Prep HPF POC: NONE SEEN
YEAST WET PREP: NONE SEEN

## 2013-09-22 MED ORDER — MEGESTROL ACETATE 40 MG PO TABS: 40.0000 mg | ORAL_TABLET | Freq: Two times a day (BID) | ORAL | Status: DC

## 2013-09-22 MED ORDER — OXYCODONE-ACETAMINOPHEN 5-325 MG PO TABS
1.0000 | ORAL_TABLET | Freq: Once | ORAL | Status: AC
  Administered 2013-09-22: 1 via ORAL
  Filled 2013-09-22: qty 1

## 2013-09-22 MED ORDER — CLOTRIMAZOLE 1 % EX CREA: TOPICAL_CREAM | CUTANEOUS | Status: DC

## 2013-09-22 MED ORDER — HYDROCODONE-ACETAMINOPHEN 5-325 MG PO TABS: 1.0000 | ORAL_TABLET | ORAL | Status: DC | PRN

## 2013-09-22 NOTE — ED Provider Notes (Signed)
CSN: [id number]     Arrival date & time 09/22/13  1039 History   First MD Initiated Contact with Patient 09/22/13 1113     Chief Complaint  Patient presents with  . Pelvic Pain     (Consider location/radiation/quality/duration/timing/severity/associated sxs/prior Treatment) HPI Patient speaks Ritta Slot- interview done with phone interpretor.   Patient to the ER with complaints of vaginal spotting and burning to vagina. 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 She has not had any problems like this since then. She denies having any weakness, nausea, vomiting, diarrhea, urinary symptoms. The bleeding has been brown and there has only been a little bit. She has some right sided suprapubic pain. She has been having the symptoms for two days now.  Past Medical History  Diagnosis Date  . Wears glasses    Past Surgical History  Procedure Laterality Date  . Abdominal surgery    . Dilation and curettage of uterus    . Endometrial ablation  2010   History reviewed. No pertinent family history. History  Substance Use Topics  . Smoking status: Current Some Day Smoker  . Smokeless tobacco: Not on file  . Alcohol Use: No   OB History   Grav Para Term Preterm Abortions TAB SAB Ect Mult Living                 Review of Systems  The patient denies anorexia, fever, weight loss,, vision loss, decreased hearing, hoarseness, chest pain, syncope, dyspnea on exertion, peripheral edema, balance deficits, hemoptysis, abdominal pain, melena, hematochezia, severe indigestion/heartburn, hematuria, incontinence, genital sores, muscle weakness, suspicious skin lesions, transient blindness, difficulty walking, depression, unusual weight change,  enlarged lymph nodes, angioedema, and breast masses.   Allergies  Review of patient's allergies indicates no known allergies.  Home Medications   Current Outpatient Rx  Name  Route  Sig  Dispense  Refill  . ibuprofen (ADVIL,MOTRIN)  600 MG tablet   Oral   Take 600 mg by mouth every 8 (eight) hours as needed (pain).         Marland Kitchen HYDROcodone-acetaminophen (NORCO/VICODIN) 5-325 MG per tablet   Oral   Take 1-2 tablets by mouth every 4 (four) hours as needed.   20 tablet   0   . megestrol (MEGACE) 40 MG tablet   Oral   Take 1 tablet (40 mg total) by mouth 2 (two) times daily.   30 tablet   0     Please take 40 mg twice a day for the first 3 days ...    BP 104/62  Pulse 57  Temp(Src) 98.2 F (36.8 C) (Oral)  Resp 20  Ht 5' (1.524 m)  Wt 120 lb (54.432 kg)  BMI 23.44 kg/m2  SpO2 95% Physical Exam  Nursing note and vitals reviewed. Constitutional: She appears well-developed and well-nourished. No distress.  HENT:  Head: Normocephalic and atraumatic.  Eyes: Pupils are equal, round, and reactive to light.  Neck: Normal range of motion. Neck supple.  Cardiovascular: Normal rate and regular rhythm.   Pulmonary/Chest: Effort normal.  Abdominal: Soft.  Genitourinary: Uterus is tender. Uterus is not deviated, not enlarged and not fixed. Cervix exhibits no motion tenderness, no discharge and no friability. Right adnexum displays tenderness. Right adnexum displays no mass and no fullness. Left adnexum displays no mass, no tenderness and no fullness. There is bleeding around the vagina. No tenderness around the vagina. No foreign body around the vagina. No vaginal discharge found.  Neurological: She is alert.  Skin: Skin is warm and dry.    ED Course  Procedures (including critical care time) Labs Review Labs Reviewed  URINALYSIS, ROUTINE W REFLEX MICROSCOPIC - Abnormal; Notable for the following:    APPearance CLOUDY (*)    Hgb urine dipstick LARGE (*)    All other components within normal limits  COMPREHENSIVE METABOLIC PANEL - Abnormal; Notable for the following:    BUN 5 (*)    All other components within normal limits  WET PREP, GENITAL  GC/CHLAMYDIA PROBE AMP  CBC  URINE MICROSCOPIC-ADD ON   Imaging  Review US Transvaginal Non-ob  09/22/2013   CLINICAL DATA:  Vaginal bleeding and pelvic pain. Prior endometrial ablation.  EXAM: TRANSABDOMINAL AND TRANSVAGINAL ULTRASOUND OF PELVIS  TECHNIQUE: Both transabdominal and transvaginal ultrasound examinations of the pelvis were performed. Transabdominal technique was performed for global imaging of the pelvis including uterus, ovaries, adnexal regions, and pelvic cul-de-sac. It was necessary to proceed with endovaginal exam following the transabdominal exam to visualize the uterus, ovaries, and adnexa.  COMPARISON:  CT ABD/PELVIS W CM dated 07/23/2010; US TRANSVAGINAL NON-OB dated 05/10/2010  FINDINGS: Uterus: 8.1 x 4.9 x 5.8 cm. Mildly enlarged. Diffusely heterogeneous in echotexture. A right-sided uterine body/fundal mass measures 3.0 x 2.5 x 2.8 cm.  A posterior uterine body mass measures 3.9 x 3.2 x 2.8 cm.  Endometrium: Poorly visualized. Normal in its visualized portions; 8 mm.  Right Ovary:  3.9 x 1.1 x 2.0 cm. Normal in morphology.  Left Ovary:  Not visualized.  No adnexal mass seen.  Other Findings:  Trace free pelvic fluid is likely physiologic.  IMPRESSION: 1. Increase in uterine fibroid burden. 2. Although the endometrium is poorly visualized secondary to the extent of fibroids, no evidence of endometrial thickening identified. 3. Lack of visualization of the left ovary.   Electronically Signed   By: Abigail Miyamoto M.D.   On: 09/22/2013 14:01   US Pelvis Complete  09/22/2013   CLINICAL DATA:  Vaginal bleeding and pelvic pain. Prior endometrial ablation.  EXAM: TRANSABDOMINAL AND TRANSVAGINAL ULTRASOUND OF PELVIS  TECHNIQUE: Both transabdominal and transvaginal ultrasound examinations of the pelvis were performed. Transabdominal technique was performed for global imaging of the pelvis including uterus, ovaries, adnexal regions, and pelvic cul-de-sac. It was necessary to proceed with endovaginal exam following the transabdominal exam to visualize the uterus,  ovaries, and adnexa.  COMPARISON:  CT ABD/PELVIS W CM dated 07/23/2010; US TRANSVAGINAL NON-OB dated 05/10/2010  FINDINGS: Uterus: 8.1 x 4.9 x 5.8 cm. Mildly enlarged. Diffusely heterogeneous in echotexture. A right-sided uterine body/fundal mass measures 3.0 x 2.5 x 2.8 cm.  A posterior uterine body mass measures 3.9 x 3.2 x 2.8 cm.  Endometrium: Poorly visualized. Normal in its visualized portions; 8 mm.  Right Ovary:  3.9 x 1.1 x 2.0 cm. Normal in morphology.  Left Ovary:  Not visualized.  No adnexal mass seen.  Other Findings:  Trace free pelvic fluid is likely physiologic.  IMPRESSION: 1. Increase in uterine fibroid burden. 2. Although the endometrium is poorly visualized secondary to the extent of fibroids, no evidence of endometrial thickening identified. 3. Lack of visualization of the left ovary.   Electronically Signed   By: Abigail Miyamoto M.D.   On: 09/22/2013 14:01     EKG Interpretation None      MDM   Final diagnoses:  Abnormal uterine bleeding    Patients work-up is positive for uterine fibroids but no other acute abnormalities noted.  [facility].Will start her on megace and she needs to follow-up with gynecology.   48 y.o.Leonore Luebbe's evaluation in the Emergency Department is complete. It has been determined that no acute conditions requiring further emergency intervention are present at this time. The patient/guardian have been advised of the diagnosis and plan. We have discussed signs and symptoms that warrant return to the ED, such as changes or worsening in symptoms.  Vital signs are stable at discharge. Filed Vitals:   09/22/13 1430  BP: 104/62  Pulse: 57  Temp:   Resp:     Patient/guardian has voiced understanding and agreed to follow-up with the PCP or specialist.     Linus Mako, PA-C 09/22/13 1501

## 2013-09-22 NOTE — ED Notes (Addendum)
Pt reports pelvic pain and vaginal bleeding since yesterday. It feels like when she had to have ovarian surgery in 2010. [facility] Pt denies n/v/bowel/bladder changes. She also states she noticed brown painful spots to armpit and chest 4 days ago

## 2013-09-22 NOTE — Discharge Instructions (Signed)
°  Abnormal Uterine Bleeding Abnormal uterine bleeding means bleeding from the vagina that is not your normal menstrual period. This can be:  Bleeding or spotting between periods.  Bleeding after sex (sexual intercourse).  Bleeding that is heavier or more than normal.  Periods that last longer than usual.  Bleeding after menopause. There are many problems that may cause this. Treatment will depend on the cause of the bleeding. Any kind of bleeding that is not normal should be reviewed by your doctor.  HOME CARE Watch your condition for any changes. These actions may lessen any discomfort you are having:  Do not use tampons or douches as told by your doctor.  Change your pads often. You should get regular pelvic exams and Pap tests. Keep all appointments for tests as told by your doctor. GET HELP IF:  You are bleeding for more than 1 week.  You feel dizzy at times. GET HELP RIGHT AWAY IF:   You pass out.  You have to change pads every 15 to 30 minutes.  You have belly pain.  You have a fever.  You become sweaty or weak.  You are passing large blood clots from the vagina.  You feel sick to your stomach (nauseous) and throw up (vomit). MAKE SURE YOU:  Understand these instructions.  Will watch your condition.  Will get help right away if you are not doing well or get worse. Document Released: 05/08/2009 Document Revised: 05/01/2013 Document Reviewed: 02/07/2013 [facility] Patient Information 2014 [place], [place].

## 2013-09-22 NOTE — ED Provider Notes (Signed)
Medical screening examination/treatment/procedure(s) were performed by non-physician practitioner and as supervising physician I was immediately available for consultation/collaboration.  Richarda Blade, MD 09/22/13 [id number]

## 2013-09-22 NOTE — ED Notes (Signed)
Pt and pt's daughter comfortable with d/c and f/u instructions. Pt given number for [facility] to follow up and work note from [place].

## 2013-09-23 LAB — GC/CHLAMYDIA PROBE AMP
CT Probe RNA: NEGATIVE
GC Probe RNA: NEGATIVE

## 2013-09-26 ENCOUNTER — Ambulatory Visit (INDEPENDENT_AMBULATORY_CARE_PROVIDER_SITE_OTHER): Payer: No Typology Code available for payment source | Admitting: Family Medicine

## 2013-09-26 ENCOUNTER — Encounter: Payer: Self-pay | Admitting: Family Medicine

## 2013-09-26 VITALS — BP 118/78 | HR 60 | Wt 120.0 lb

## 2013-09-26 DIAGNOSIS — D219 Benign neoplasm of connective and other soft tissue, unspecified: Secondary | ICD-10-CM

## 2013-09-26 DIAGNOSIS — N926 Irregular menstruation, unspecified: Secondary | ICD-10-CM

## 2013-09-26 DIAGNOSIS — D259 Leiomyoma of uterus, unspecified: Secondary | ICD-10-CM

## 2013-09-26 DIAGNOSIS — N939 Abnormal uterine and vaginal bleeding, unspecified: Secondary | ICD-10-CM

## 2013-09-26 NOTE — Patient Instructions (Signed)
Take 4 Advil 3 times per day as needed for the pain only use the codeine at night. Keep the appointment with gynecology.

## 2013-09-26 NOTE — Progress Notes (Signed)
   Subjective:    Patient ID: Katherine [name], female    DOB: [birth year], 48 y.o.   MRN: [id number]  HPI She was seen recently in the emergency room for evaluation of abdominal pain and bleeding. The ER record was reviewed and did show diagnosis of abnormal uterine bleeding and fibroids. She was given Megace and pain medications. Apparently she was given a note to return to work today however her work and her here instead. She is here with her interpreter. Towards the end of the encounter she then mentioned that she is having trouble lifting her right leg due to the pain which is interfering with her ability to work. She is scheduled to see her gynecologist.   Review of Systems     Objective:   Physical Exam Alert and in no distress. Abdominal exam shows decreased bowel sounds with out masses and slight tenderness in the suprapubic area.       Assessment & Plan:  Abnormal uterine bleeding  Fibroids  was difficult communicating through an interpreter however I will give her some more time off due to the pain especially going down her right leg interfering with her work. She will be given a note to return to work on Monday. Recommend she use ibuprofen during the day and only take the codeine at night on an as-needed basis.

## 2013-09-27 ENCOUNTER — Ambulatory Visit: Payer: BC Managed Care – PPO | Admitting: Medical

## 2013-10-07 ENCOUNTER — Encounter: Payer: Self-pay | Admitting: Family Medicine

## 2013-10-07 ENCOUNTER — Ambulatory Visit (INDEPENDENT_AMBULATORY_CARE_PROVIDER_SITE_OTHER): Payer: No Typology Code available for payment source | Admitting: Family Medicine

## 2013-10-07 ENCOUNTER — Telehealth: Payer: Self-pay | Admitting: *Deleted

## 2013-10-07 VITALS — BP 133/84 | HR 70 | Temp 98.5°F | Ht 59.0 in | Wt 116.5 lb

## 2013-10-07 DIAGNOSIS — N949 Unspecified condition associated with female genital organs and menstrual cycle: Secondary | ICD-10-CM

## 2013-10-07 DIAGNOSIS — Z124 Encounter for screening for malignant neoplasm of cervix: Secondary | ICD-10-CM

## 2013-10-07 DIAGNOSIS — Z1239 Encounter for other screening for malignant neoplasm of breast: Secondary | ICD-10-CM

## 2013-10-07 DIAGNOSIS — N938 Other specified abnormal uterine and vaginal bleeding: Secondary | ICD-10-CM

## 2013-10-07 DIAGNOSIS — D259 Leiomyoma of uterus, unspecified: Secondary | ICD-10-CM | POA: Insufficient documentation

## 2013-10-07 DIAGNOSIS — R102 Pelvic and perineal pain: Secondary | ICD-10-CM | POA: Insufficient documentation

## 2013-10-07 DIAGNOSIS — N946 Dysmenorrhea, unspecified: Secondary | ICD-10-CM

## 2013-10-07 MED ORDER — MEGESTROL ACETATE 40 MG PO TABS: 40.0000 mg | ORAL_TABLET | Freq: Two times a day (BID) | ORAL | Status: DC

## 2013-10-07 MED ORDER — IBUPROFEN 200 MG PO TABS
800.0000 mg | ORAL_TABLET | Freq: Once | ORAL | Status: AC
  Administered 2013-10-07: 800 mg via ORAL

## 2013-10-07 MED ORDER — DICLOFENAC SODIUM 75 MG PO TBEC: 75.0000 mg | DELAYED_RELEASE_TABLET | Freq: Two times a day (BID) | ORAL | Status: DC

## 2013-10-07 NOTE — Telephone Encounter (Signed)
Reannon left clinic after gyn appt before lab drawn. Called patient with Velna Hatchet Interpreter -left message need to call clinic because you left before we were finished.  Needs TSH [place]

## 2013-10-07 NOTE — Patient Instructions (Signed)
Hysterectomy Information  A hysterectomy is a surgery in which your uterus is removed. This surgery may be done to treat various medical problems. After the surgery, you will no longer have menstrual periods. The surgery will also make you unable to become pregnant (sterile). The fallopian tubes and ovaries can be removed (bilateral salpingo-oophorectomy) during this surgery as well.  REASONS FOR A HYSTERECTOMY  Persistent, abnormal bleeding.  Lasting (chronic) pelvic pain or infection.  The lining of the uterus (endometrium) starts growing outside the uterus (endometriosis).  The endometrium starts growing in the muscle of the uterus (adenomyosis).  The uterus falls down into the vagina (pelvic organ prolapse).  Noncancerous growths in the uterus (uterine fibroids) that cause symptoms.  Precancerous cells.  Cervical cancer or uterine cancer. TYPES OF HYSTERECTOMIES  Supracervical hysterectomy In this type, the top part of the uterus is removed, but not the cervix.  Total hysterectomy The uterus and cervix are removed.  Radical hysterectomy The uterus, the cervix, and the fibrous tissue that holds the uterus in place in the pelvis (parametrium) are removed. WAYS A HYSTERECTOMY CAN BE PERFORMED  Abdominal hysterectomy A large surgical cut (incision) is made in the abdomen. The uterus is removed through this incision.  Vaginal hysterectomy An incision is made in the vagina. The uterus is removed through this incision. There are no abdominal incisions.  Conventional laparoscopic hysterectomy Three or four small incisions are made in the abdomen. A thin, lighted tube with a camera (laparoscope) is inserted into one of the incisions. Other tools are put through the other incisions. The uterus is cut into small pieces. The small pieces are removed through the incisions, or they are removed through the vagina.  Laparoscopically assisted vaginal hysterectomy (LAVH) Three or four small  incisions are made in the abdomen. Part of the surgery is performed laparoscopically and part vaginally. The uterus is removed through the vagina.  Robot-assisted laparoscopic hysterectomy A laparoscope and other tools are inserted into 3 or 4 small incisions in the abdomen. A computer-controlled device is used to give the surgeon a 3D image and to help control the surgical instruments. This allows for more precise movements of surgical instruments. The uterus is cut into small pieces and removed through the incisions or removed through the vagina. RISKS AND COMPLICATIONS  Possible complications associated with this procedure include:  Bleeding and risk of blood transfusion. Tell your health care provider if you do not want to receive any blood products.  Blood clots in the legs or lung.  Infection.  Injury to surrounding organs.  Problems or side effects related to anesthesia.  Conversion to an abdominal hysterectomy from one of the other techniques. WHAT TO EXPECT AFTER A HYSTERECTOMY  You will be given pain medicine.  You will need to have someone with you for the first 3 5 days after you go home.  You will need to follow up with your surgeon in 2 4 weeks after surgery to evaluate your progress.  You may have early menopause symptoms such as hot flashes, night sweats, and insomnia.  If you had a hysterectomy for a problem that was not cancer or not a condition that could lead to cancer, then you no longer need Pap tests. However, even if you no longer need a Pap test, a regular exam is a good idea to make sure no other problems are starting. Document Released: 01/04/2001 Document Revised: 05/01/2013 Document Reviewed: 03/18/2013 [facility] Patient Information 2014 [place].

## 2013-10-07 NOTE — Progress Notes (Signed)
    Subjective:    Patient ID: Katherine [name] is a 48 y.o. female presenting with Pelvic Pain  on 10/07/2013  HPI: Pt. Is a [id number] who has long h/o fibroids and dysmenorrhea and who has had a D & C with hysteroscopic resection of fibroid and a Novasure ablation. Now with resumed bleeding and u/s showing return of fibroids.  She desires permanent treatment.   Review of Systems  Constitutional: Negative for fever, chills and fatigue.  Respiratory: Positive for shortness of breath. Negative for cough.   Gastrointestinal: Negative for vomiting, abdominal pain and constipation.  Genitourinary: Positive for vaginal bleeding, vaginal pain and pelvic pain.  Neurological: Negative for headaches.  Psychiatric/Behavioral: Negative for dysphoric mood.      Objective:    BP 133/84  Pulse 70  Temp(Src) 98.5 F (36.9 C)  Ht 4\' 11"  (1.499 m)  Wt 116 lb 8 oz (52.844 kg)  BMI 23.52 kg/m2  LMP 09/22/2013 Physical Exam  Vitals reviewed. Constitutional: She is oriented to person, place, and time. She appears well-developed and well-nourished.  HENT:  Head: Normocephalic and atraumatic.  Pulmonary/Chest: Effort normal and breath sounds normal.  Abdominal: Soft. There is no tenderness.  Musculoskeletal: She exhibits no edema.  Neurological: She is alert and oriented to person, place, and time.  Skin: Skin is warm and dry.  Psychiatric: She has a normal mood and affect.   Procedure: Attempt made at EMB with pipelle and dilator--unsuccessful likely secondary to ablation.   U/SFINDINGS:  Uterus: 8.1 x 4.9 x 5.8 cm. Mildly enlarged. Diffusely heterogeneous  in echotexture. A right-sided uterine body/fundal mass measures 3.0  x 2.5 x 2.8 cm.  A posterior uterine body mass measures 3.9 x 3.2 x 2.8 cm.  Endometrium: Poorly visualized. Normal in its visualized portions; 8  mm.  Right Ovary: 3.9 x 1.1 x 2.0 cm. Normal in morphology.  Left Ovary: Not visualized. No adnexal mass seen.  Other Findings:  Trace free pelvic fluid is likely physiologic.  IMPRESSION:  1. Increase in uterine fibroid burden.  2. Although the endometrium is poorly visualized secondary to the  extent of fibroids, no evidence of endometrial thickening  identified.  3. Lack of visualization of the left ovary.       Assessment & Plan:   Fibroid uterus Likely cause of both pain and bleeding--will schedule for hysterectomy--TVH/BS--Risks/benefits/alternatives to treatment discussed-Risks include but are not limited to bleeding, infection, injury to surrounding structures, including bowel, bladder and ureters, blood clots, and death.  Likelihood of success is high.     Return in about 3 months (around 01/07/2014) for postop check.

## 2013-10-07 NOTE — Assessment & Plan Note (Addendum)
Likely cause of both pain and bleeding--will schedule for hysterectomy--TVH/BS--Risks/benefits/alternatives to treatment discussed-Risks include but are not limited to bleeding, infection, injury to surrounding structures, including bowel, bladder and ureters, blood clots, and death.  Likelihood of success is high. Check FSH, TSH. Continue Megace for bleeding.  Pap smear today.

## 2013-10-08 ENCOUNTER — Encounter: Payer: Self-pay | Admitting: *Deleted

## 2013-10-08 NOTE — Telephone Encounter (Signed)
Sent a letter to notify we need labs drawn, also put note on ultrasound appointment for them to send her to clinic for labs.

## 2013-10-08 NOTE — Telephone Encounter (Signed)
Called Katherine [name] with [employer] and left a message we need you to call clinic .

## 2013-10-11 ENCOUNTER — Encounter: Payer: Self-pay | Admitting: Obstetrics & Gynecology

## 2013-10-11 ENCOUNTER — Other Ambulatory Visit: Payer: No Typology Code available for payment source

## 2013-10-11 DIAGNOSIS — N925 Other specified irregular menstruation: Secondary | ICD-10-CM

## 2013-10-11 DIAGNOSIS — N949 Unspecified condition associated with female genital organs and menstrual cycle: Secondary | ICD-10-CM

## 2013-10-11 LAB — TSH: TSH: 2.202 u[IU]/mL (ref 0.350–4.500)

## 2013-10-11 LAB — FOLLICLE STIMULATING HORMONE: FSH: 38.4 m[IU]/mL

## 2013-10-15 ENCOUNTER — Other Ambulatory Visit: Payer: Self-pay | Admitting: Family Medicine

## 2013-10-15 ENCOUNTER — Encounter: Payer: Self-pay | Admitting: Medical

## 2013-10-15 ENCOUNTER — Ambulatory Visit (INDEPENDENT_AMBULATORY_CARE_PROVIDER_SITE_OTHER): Payer: No Typology Code available for payment source | Admitting: Medical

## 2013-10-15 VITALS — BP 100/80 | HR 80 | Temp 98.3°F | Resp 16 | Wt 118.0 lb

## 2013-10-15 DIAGNOSIS — R102 Pelvic and perineal pain: Secondary | ICD-10-CM

## 2013-10-15 DIAGNOSIS — Z8742 Personal history of other diseases of the female genital tract: Secondary | ICD-10-CM

## 2013-10-15 DIAGNOSIS — N949 Unspecified condition associated with female genital organs and menstrual cycle: Secondary | ICD-10-CM

## 2013-10-15 DIAGNOSIS — N938 Other specified abnormal uterine and vaginal bleeding: Secondary | ICD-10-CM

## 2013-10-15 DIAGNOSIS — R5383 Other fatigue: Secondary | ICD-10-CM

## 2013-10-15 DIAGNOSIS — R5381 Other malaise: Secondary | ICD-10-CM

## 2013-10-15 DIAGNOSIS — N925 Other specified irregular menstruation: Secondary | ICD-10-CM

## 2013-10-15 DIAGNOSIS — R531 Weakness: Secondary | ICD-10-CM

## 2013-10-15 LAB — CBC WITH DIFFERENTIAL/PLATELET
BASOS PCT: 0 % (ref 0–1)
Basophils Absolute: 0 10*3/uL (ref 0.0–0.1)
EOS ABS: 0.2 10*3/uL (ref 0.0–0.7)
EOS PCT: 2 % (ref 0–5)
HCT: 40.3 % (ref 36.0–46.0)
HEMOGLOBIN: 13.5 g/dL (ref 12.0–15.0)
Lymphocytes Relative: 18 % (ref 12–46)
Lymphs Abs: 1.6 10*3/uL (ref 0.7–4.0)
MCH: 29.5 pg (ref 26.0–34.0)
MCHC: 33.5 g/dL (ref 30.0–36.0)
MCV: 88.2 fL (ref 78.0–100.0)
MONOS PCT: 4 % (ref 3–12)
Monocytes Absolute: 0.3 10*3/uL (ref 0.1–1.0)
NEUTROS PCT: 76 % (ref 43–77)
Neutro Abs: 6.6 10*3/uL (ref 1.7–7.7)
PLATELETS: 220 10*3/uL (ref 150–400)
RBC: 4.57 MIL/uL (ref 3.87–5.11)
RDW: 13.4 % (ref 11.5–15.5)
WBC: 8.7 10*3/uL (ref 4.0–10.5)

## 2013-10-15 LAB — BASIC METABOLIC PANEL
BUN: 8 mg/dL (ref 6–23)
CALCIUM: 9.9 mg/dL (ref 8.4–10.5)
CO2: 23 mEq/L (ref 19–32)
Chloride: 107 mEq/L (ref 96–112)
Creat: 0.64 mg/dL (ref 0.50–1.10)
Glucose, Bld: 141 mg/dL — ABNORMAL HIGH (ref 70–99)
Potassium: 4 mEq/L (ref 3.5–5.3)
SODIUM: 138 meq/L (ref 135–145)

## 2013-10-15 MED ORDER — MEGESTROL ACETATE 40 MG PO TABS: 40.0000 mg | ORAL_TABLET | Freq: Two times a day (BID) | ORAL | Status: DC

## 2013-10-15 NOTE — Patient Instructions (Signed)
Encounter Diagnoses  Name Primary?  . Pelvic pain Yes  . Generalized weakness   . Dysfunctional uterine bleeding   . History of heavy vaginal bleeding    Recommendations:  Based on your symptoms and bleeding, you should proceed with the hysterectomy surgery as planned per Dr. Kennon Rounds  1) call Dr. Virginia Crews office, [place] to ask her nurse to write a letter for light duty until we can come up with a solutions 2) ask Dr. Kennon Rounds about another solution to move forward with treatment now in light of the weakness, ongoing bleeding, and inability to work her normal duties 3) call [facility] Social Worker to try and work out a financial solution [phone]

## 2013-10-15 NOTE — Progress Notes (Signed)
Subjective:   Katherine [name] is a 48 y.o. female presenting on 10/15/2013 with bleeding from vaginal area, thighs and backside hurts  Here with interpretor today.  She reports continuing vaginal bleeding for 25 days, ongoing pelvic pain.  When she wakes up in the morning, having pain in thighs limiting her ability to get up and moving, significant abdominal/pelvic pain, feeling very weak.  Saw gynecology 3/16, plan was to have hysterectomy in early April.  She has postponed surgery due to having to pay high deductible.  Talked to office at [facility] and inquire about the procedure, and they advised she contact her insurer about coverage.   She has not been back in tough with them.  She decided to try and work through the pain and weakness to get the money for the surgery.  Has not called back gynecology office yet.   She notes generalized weakness, pain in thighs, pelvis, lower abdomen.   No recent injury, fall, trauma.  No recent fever.  She has had some nausea and vomiting. No hematemesis, no blood in stool, no joint pain or swelling.   No other complaint.  Review of Systems ROS as in subjective      Objective:     Filed Vitals:   10/15/13 0813  BP: 100/80  Pulse: 80  Temp: 98.3 F (36.8 C)  Resp: 16    General appearance: alert, no distress, WD/WN Heart: RRR, normal S1, S2, no murmurs Lungs: CTA bilaterally, no wheezes, rhonchi, or rales Abdomen: +bs, soft, mild to moderate general lower abdominal tendnerss, non distended, no masses, no hepatomegaly, no splenomegaly Back: mild paraspinal tenderness of lumbar region MSK: pain noted in pelvis with hip ROM bilat, but no LE tenderness, normal ROM, no joint swelling Pulses: 2+ symmetric, upper and lower extremities, normal cap refill Ext: no edema      Assessment: Encounter Diagnoses  Name Primary?  . Pelvic pain Yes  . Generalized weakness   . Dysfunctional uterine bleeding   . History of heavy vaginal bleeding        Plan: We discussed her situation.  I advised that I can't write her light duty for 9 months to give her a chance to save up for surgery.   With her current symptoms, I advised that we need to find another solution so she can proceed with surgery.   i gave her a note for light duty x 1 week, but I advised she contact Dr. Virginia Crews office at [facility] for a note for light duty beyond a week at her discretion as I can't make that determination.  Advised she call patient accounting at [facility] to help find a solution.   [place] office mentioned that [facility] sometimes does this procedure for those in financial need based on income and will work with her.  This may be a solution.    We will check labs today to make sure no dangerously low hemoglobin or major electrolyte abnormalities.  otherwise advised light duty, and f/u with the recommendations above.  Katherine [name] was seen today for bleeding from vaginal area, thighs and backside hurts.  Diagnoses and associated orders for this visit:  Pelvic pain - CBC with Differential - Basic metabolic panel  Generalized weakness - CBC with Differential - Basic metabolic panel  Dysfunctional uterine bleeding - CBC with Differential - Basic metabolic panel  History of heavy vaginal bleeding - CBC with Differential - Basic metabolic panel  Return pending labs.

## 2013-10-17 ENCOUNTER — Ambulatory Visit (HOSPITAL_COMMUNITY): Payer: No Typology Code available for payment source

## 2013-10-21 ENCOUNTER — Emergency Department (HOSPITAL_COMMUNITY)
Admission: EM | Admit: 2013-10-21 | Discharge: 2013-10-21 | Disposition: A | Discharge status: Home / Self Care | Payer: No Typology Code available for payment source | Attending: Emergency Medicine | Admitting: Emergency Medicine

## 2013-10-21 ENCOUNTER — Encounter (HOSPITAL_COMMUNITY): Payer: Self-pay | Admitting: Emergency Medicine

## 2013-10-21 ENCOUNTER — Emergency Department (INDEPENDENT_AMBULATORY_CARE_PROVIDER_SITE_OTHER)
Admission: EM | Admit: 2013-10-21 | Discharge: 2013-10-21 | Disposition: A | Discharge status: Nursing Home (Includes ICF) | Payer: No Typology Code available for payment source | Source: Home / Self Care | Attending: Emergency Medicine | Admitting: Emergency Medicine

## 2013-10-21 ENCOUNTER — Emergency Department (HOSPITAL_COMMUNITY): Payer: No Typology Code available for payment source

## 2013-10-21 DIAGNOSIS — Z789 Other specified health status: Secondary | ICD-10-CM | POA: Insufficient documentation

## 2013-10-21 DIAGNOSIS — Z9851 Tubal ligation status: Secondary | ICD-10-CM | POA: Insufficient documentation

## 2013-10-21 DIAGNOSIS — Z8742 Personal history of other diseases of the female genital tract: Secondary | ICD-10-CM | POA: Insufficient documentation

## 2013-10-21 DIAGNOSIS — R0789 Other chest pain: Secondary | ICD-10-CM

## 2013-10-21 DIAGNOSIS — R109 Unspecified abdominal pain: Secondary | ICD-10-CM | POA: Insufficient documentation

## 2013-10-21 DIAGNOSIS — Z791 Long term (current) use of non-steroidal anti-inflammatories (NSAID): Secondary | ICD-10-CM | POA: Insufficient documentation

## 2013-10-21 DIAGNOSIS — R071 Chest pain on breathing: Secondary | ICD-10-CM | POA: Insufficient documentation

## 2013-10-21 DIAGNOSIS — Z79899 Other long term (current) drug therapy: Secondary | ICD-10-CM | POA: Insufficient documentation

## 2013-10-21 DIAGNOSIS — R0602 Shortness of breath: Secondary | ICD-10-CM | POA: Insufficient documentation

## 2013-10-21 DIAGNOSIS — Z87891 Personal history of nicotine dependence: Secondary | ICD-10-CM | POA: Insufficient documentation

## 2013-10-21 DIAGNOSIS — Z9889 Other specified postprocedural states: Secondary | ICD-10-CM | POA: Insufficient documentation

## 2013-10-21 DIAGNOSIS — R079 Chest pain, unspecified: Secondary | ICD-10-CM

## 2013-10-21 LAB — BASIC METABOLIC PANEL
BUN: 6 mg/dL (ref 6–23)
CALCIUM: 9.3 mg/dL (ref 8.4–10.5)
CO2: 20 mEq/L (ref 19–32)
Chloride: 109 mEq/L (ref 96–112)
Creatinine, Ser: 0.51 mg/dL (ref 0.50–1.10)
Glucose, Bld: 101 mg/dL — ABNORMAL HIGH (ref 70–99)
POTASSIUM: 3.5 meq/L — AB (ref 3.7–5.3)
Sodium: 142 mEq/L (ref 137–147)

## 2013-10-21 LAB — CBC WITH DIFFERENTIAL/PLATELET
Basophils Absolute: 0 10*3/uL (ref 0.0–0.1)
Basophils Relative: 0 % (ref 0–1)
EOS ABS: 0.1 10*3/uL (ref 0.0–0.7)
Eosinophils Relative: 1 % (ref 0–5)
HCT: 36.6 % (ref 36.0–46.0)
Hemoglobin: 12.7 g/dL (ref 12.0–15.0)
LYMPHS ABS: 2.2 10*3/uL (ref 0.7–4.0)
Lymphocytes Relative: 21 % (ref 12–46)
MCH: 30.5 pg (ref 26.0–34.0)
MCHC: 34.7 g/dL (ref 30.0–36.0)
MCV: 88 fL (ref 78.0–100.0)
Monocytes Absolute: 0.6 10*3/uL (ref 0.1–1.0)
Monocytes Relative: 5 % (ref 3–12)
NEUTROS PCT: 73 % (ref 43–77)
Neutro Abs: 7.7 10*3/uL (ref 1.7–7.7)
Platelets: 192 10*3/uL (ref 150–400)
RBC: 4.16 MIL/uL (ref 3.87–5.11)
RDW: 12.7 % (ref 11.5–15.5)
WBC: 10.6 10*3/uL — ABNORMAL HIGH (ref 4.0–10.5)

## 2013-10-21 LAB — I-STAT TROPONIN, ED
TROPONIN I, POC: 0 ng/mL (ref 0.00–0.08)
TROPONIN I, POC: 0 ng/mL (ref 0.00–0.08)
Troponin i, poc: 0 ng/mL (ref 0.00–0.08)

## 2013-10-21 LAB — D-DIMER, QUANTITATIVE (NOT AT ARMC): D-Dimer, Quant: 0.4 ug/mL-FEU (ref 0.00–0.48)

## 2013-10-21 MED ORDER — ONDANSETRON HCL 4 MG/2ML IJ SOLN: 4.0000 mg | Freq: Once | INTRAMUSCULAR | Status: DC

## 2013-10-21 MED ORDER — HYDROMORPHONE HCL PF 1 MG/ML IJ SOLN
1.0000 mg | Freq: Once | INTRAMUSCULAR | Status: AC
  Administered 2013-10-21: 1 mg via INTRAMUSCULAR

## 2013-10-21 MED ORDER — NITROGLYCERIN 0.4 MG SL SUBL
SUBLINGUAL_TABLET | SUBLINGUAL | Status: AC
  Filled 2013-10-21: qty 1

## 2013-10-21 MED ORDER — ASPIRIN 81 MG PO CHEW
324.0000 mg | CHEWABLE_TABLET | Freq: Once | ORAL | Status: AC
  Administered 2013-10-21: 324 mg via ORAL

## 2013-10-21 MED ORDER — HYDROMORPHONE HCL PF 1 MG/ML IJ SOLN
1.0000 mg | Freq: Once | INTRAMUSCULAR | Status: AC
  Administered 2013-10-21: 1 mg via INTRAVENOUS
  Filled 2013-10-21: qty 1

## 2013-10-21 MED ORDER — ONDANSETRON HCL 4 MG/2ML IJ SOLN
4.0000 mg | Freq: Once | INTRAMUSCULAR | Status: AC
  Administered 2013-10-21: 4 mg via INTRAVENOUS
  Filled 2013-10-21: qty 2

## 2013-10-21 MED ORDER — HYDROMORPHONE HCL PF 1 MG/ML IJ SOLN
INTRAMUSCULAR | Status: AC
  Filled 2013-10-21: qty 1

## 2013-10-21 MED ORDER — HYDROCODONE-ACETAMINOPHEN 5-325 MG PO TABS: 2.0000 | ORAL_TABLET | Freq: Four times a day (QID) | ORAL | Status: DC | PRN

## 2013-10-21 MED ORDER — NITROGLYCERIN 0.4 MG SL SUBL
0.4000 mg | SUBLINGUAL_TABLET | SUBLINGUAL | Status: AC | PRN
  Administered 2013-10-21: 0.4 mg via SUBLINGUAL

## 2013-10-21 MED ORDER — ONDANSETRON 4 MG PO TBDP
ORAL_TABLET | ORAL | Status: AC
  Filled 2013-10-21: qty 1

## 2013-10-21 MED ORDER — ASPIRIN 81 MG PO CHEW
CHEWABLE_TABLET | ORAL | Status: AC
  Filled 2013-10-21: qty 4

## 2013-10-21 MED ORDER — HYDROMORPHONE HCL PF 1 MG/ML IJ SOLN: 1.0000 mg | Freq: Once | INTRAMUSCULAR | Status: DC

## 2013-10-21 NOTE — ED Notes (Signed)
Pt reports dizziness and nausea. Pt ambulated to restroom with standby assist.

## 2013-10-21 NOTE — ED Notes (Signed)
Md Bednar at bedside discussing results

## 2013-10-21 NOTE — ED Notes (Signed)
Pt returns from xray

## 2013-10-21 NOTE — ED Notes (Addendum)
Stabbing left lower cp tender and sore to touch started today constant has difficulty breathing at [facility] given 324 asa po  and 1 mg dilaudid IM at [facility] ems gave 1 nitro and 4 mg zofran pt has iv rt ac all info thru language line [place] dr bednar at bedside on line w/ pt cbg per [facility] 128

## 2013-10-21 NOTE — ED Notes (Signed)
Via language line, [place] Pt c/o chest pain on left side onset 0830 Pain is constant; pt is rocking back in forth Sxs also include: SOB, vomiting  Reports no hx of CAD Dr. Jake Michaelis is in the room w/the pt.

## 2013-10-21 NOTE — Discharge Instructions (Signed)
We have determined that your problem requires further evaluation in the emergency department.  We will take care of your transport there.  Once at the emergency department, you will be evaluated by a provider and they will order whatever treatment or tests they deem necessary.  We cannot guarantee that they will do any specific test or do any specific treatment.  ° °

## 2013-10-21 NOTE — Discharge Instructions (Signed)
Chest Pain (Nonspecific) Chest pain has many causes. Your pain could be caused by something serious, such as a heart attack or a blood clot in the lungs. It could also be caused by something less serious, such as a chest bruise or a virus. Follow up with your doctor. More lab tests or other studies may be needed to find the cause of your pain. Most of the time, nonspecific chest pain will improve within 2 to 3 days of rest and mild pain medicine. HOME CARE  For chest bruises, you may put ice on the sore area for 15-20 minutes, 03-04 times a day. Do this only if it makes you feel better.  Put ice in a plastic bag.  Place a towel between the skin and the bag.  Rest for the next 2 to 3 days.  Go back to work if the pain improves.  See your doctor if the pain lasts longer than 1 to 2 weeks.  Only take medicine as told by your doctor.  Quit smoking if you smoke. GET HELP RIGHT AWAY IF:   There is more pain or pain that spreads to the arm, neck, jaw, back, or belly (abdomen).  You have shortness of breath.  You cough more than usual or cough up blood.  You have very bad back or belly pain, feel sick to your stomach (nauseous), or throw up (vomit).  You have very bad weakness.  You pass out (faint).  You have a fever. Any of these problems may be serious and may be an emergency. Do not wait to see if the problems will go away. Get medical help right away. Call your local emergency services 911 in [place] Do not drive yourself to the hospital. MAKE SURE YOU:   Understand these instructions.  Will watch this condition.  Will get help right away if you or your child is not doing well or gets worse. Document Released: 12/28/2007 Document Revised: 10/03/2011 Document Reviewed: 12/28/2007 [facility] Patient Information 2014 [place], [place].   Emergency Department Resource Guide 1) Find a Doctor and Pay Out of Pocket Although you won't have to find out who is covered by your insurance  plan, it is a good idea to ask around and get recommendations. You will then need to call the office and see if the doctor you have chosen will accept you as a new patient and what types of options they offer for patients who are self-pay. Some doctors offer discounts or will set up payment plans for their patients who do not have insurance, but you will need to ask so you aren't surprised when you get to your appointment.  2) Contact Your Local Health Department Not all health departments have doctors that can see patients for sick visits, but many do, so it is worth a call to see if yours does. If you don't know where your local health department is, you can check in your phone book. The CDC also has a tool to help you locate your state's health department, and many state websites also have listings of all of their local health departments.  3) Find a [facility] If your illness is not likely to be very severe or complicated, you may want to try a walk in clinic. These are popping up all over the country in pharmacies, drugstores, and shopping centers. They're usually staffed by nurse practitioners or physician assistants that have been trained to treat common illnesses and complaints. They're usually fairly quick and inexpensive.  However, if you have serious medical issues or chronic medical problems, these are probably not your best option.  No Primary Care Doctor: - Call Health Connect at  [phone] - they can help you locate a primary care doctor that  accepts your insurance, provides certain services, etc. - Physician Referral Service- [phone]  Chronic Pain Problems: Organization         Address  Phone   Notes  [facility]  [phone] Patients need to be referred by their primary care doctor.   Medication Assistance: Organization         Address  Phone   Notes  [facility] Medication [facility] [address] [place], [place] [address]  [phone] --Must be a resident of [facility] -- Must have NO insurance coverage whatsoever (no Medicaid/ Medicare, etc.) -- The pt. MUST have a primary care doctor that directs their care regularly and follows them in the community   MedAssist  [phone]   [employer]  [phone]    Agencies that provide inexpensive medical care: Organization         Address  Phone   Notes  [place]  [phone]   [facility]    [phone]   [facility] [place],  [address] [phone]   [place] [address] [place] [phone]   Planned Parenthood    [phone]   [facility]    [phone]   [place] and [address], [place] Phone:  [phone], Fax:  [phone] Hours of Operation:  9 am - 6 pm, M-F.  Also accepts Medicaid/Medicare and self-pay.  [facility] for [place] [address], [address], [place] Phone: [phone], Fax: [phone]. Hours of Operation:  8:30 am - 5:30 pm, M-F.  Also accepts Medicaid and self-pay.  [facility] [place] [address] [place] Phone: [phone]   [place], [place], [place] [phone], Ext. [phone] Mondays & Thursdays: 7-9 AM.  First 15 patients are seen on a first come, first serve basis.    [place] Providers:  Organization         Address  Phone   Notes  [facility] [address], [place] [phone] Also accepts self-pay patients.  [facility] [address], [place]  [phone]   [place], [address], [place] [phone]   [facility] [address], [place] [phone]   Lucianne Lei [address], [place]   [phone] Only accepts [place] Access [place] patients after they have their name applied to their card.   Self-Pay (no insurance) in [facility]:  Organization         Address  Phone   Notes  Sickle Cell Patients, [facility] [place] [phone]   [facility] [place] [phone]   [facility] [place]  [place], [place], [place] [phone]   [facility]/Dr. Osei-Bonsu  [address], [place] or [address], [place] [phone] Phone number for both [place] and [place] locations is the same.  Urgent Medical and [facility] [address], [place] [phone]   [facility] [address], [place] or [address] [phone]   [facility] [address] [place] [phone], phone; [phone], fax Sees patients 1st and 3rd Saturday of every month.  Must not qualify for public or private insurance (i.e. Medicaid, Medicare, [place] Health Choice, Veterans' Benefits)  Household income should be no more than 200% of the poverty level The clinic cannot treat you if you are pregnant or think you are pregnant  Sexually transmitted diseases are not treated at the clinic.   Dental Care: Organization         Address  Phone  Notes  [facility] Department of [facility] [place] [phone] Accepts children up to age 71 who are enrolled in [place] or [place]; pregnant women with a Medicaid card; and children who have applied for Medicaid or [place] Health Choice, but were declined, whose parents can pay a reduced fee at time of service.  [facility] Department of [facility]  [address], [place] [phone] Accepts children up to age 45 who are enrolled in [place] or [place]; pregnant women with a Medicaid card; and children who have applied for Medicaid or [place] Health Choice, but were declined, whose parents can pay a reduced fee at time of service.  [place] Adult Dental Access PROGRAM  [place] [phone] Patients are seen by appointment only. Walk-ins are not accepted. [place] will see patients 16 years of age and older. Monday - Tuesday (8am-5pm) Most Wednesdays (8:30-5pm) $30 per visit, cash only  [facility] Adult Dental Access PROGRAM  [address], [facility] [phone] Patients are seen by appointment only. Walk-ins are not accepted. [place] will see patients 62 years of age and older. One Wednesday Evening (Monthly: Volunteer Based).  $30 per visit, cash only  [place]  [phone] for adults; Children under age 67, call Graduate Pediatric Dentistry at [phone]. Children aged 59-14, please call [phone] to request a pediatric application.  Dental services are provided in all areas of dental care including fillings, crowns and bridges, complete and partial dentures, implants, gum treatment, root canals, and extractions. Preventive care is also provided. Treatment is provided to both adults and children. Patients are selected via a lottery and there is often a waiting list.   [facility] [address] [place]  [phone] [email or web address]   [facility] [address] [place], [place] [phone], Ext. [phone] Second and Fourth Thursday of each month, opens at 6:30 AM; Clinic ends at 9 AM.  Patients are seen on a first-come first-served basis, and a limited number are seen during each clinic.   [facility]  [address] [place], [place] [phone]   Eligibility Requirements You must have lived in [place], [place], or [place] counties for at least the last three months.   You cannot be eligible for state or  federal sponsored [employer], including [employer], [place], or [employer].   You generally cannot be eligible for healthcare insurance through your employer.    How to apply: Eligibility screenings are held every Tuesday and Wednesday afternoon from 1:00 pm until 4:00 pm. You do  not need an appointment for the interview!  [facility] [address], [place], [place]   [place]  [place] Department  [place]  [phone]    Behavioral Health Resources in the Community: Intensive Outpatient Programs Organization         Address  Phone  Notes  [place]. [address] [place] [employer], [place] [phone]   [facility] [address], [place], [place]   ADS: Alcohol & Drug Svcs [address], [place], [place]   [place] [address]  [place], [place] or [phone]   Substance Abuse Resources Organization         Address  Phone  Notes  Alcohol and Drug Services  [phone]   [place]  [phone]   [facility] [place]   Chinita Pester  [phone]   Residential & Outpatient Substance Abuse Program  [phone]   Psychological Services Organization         Address  Phone  Notes  [facility] [place]  [place]  [phone]   [place] [address], [place] or [phone]    Mobile Crisis Teams Organization         Address  Phone  Notes  [facility], Mobile Crisis Care Unit  [phone]   [facility]  [address] [place], [place]   [place] [address], [place] [phone]    Self-Help/Support Groups Organization         Address  Phone              Notes  [place]. of [place] - variety of support groups  [place] Call for more information  Narcotics Anonymous (NA), [facility] [address], [employer] [place]  2 meetings at this location   Special educational needs teacher         Address  Phone  Notes  [facility]AP Residential Treatment [place],    [place]  [phone]   [facility]  [address], [place] [phone], [place], [place]   [place], [place] [phone] Admissions: 8am-3pm M-F  Incentives Substance [place] [address]    [place], [place] [phone]   [facility] [address] [place], [place], [place]   [facility] ([place]) [address]  [place], [place]   [facility] Programs - Intensive Outpatient [address] Kristeen [address], [place], [place]   [facility] ([place].,  [place], [place] [phone] or [phone]   [facility] (RTS) [address] [place], [place] Accepts Medicaid  [facility] [place] [address],  [place] [phone] Substance Abuse/Addiction Treatment   [facility] Organization         Address  Phone  Notes  [facility]  [phone]   Domenic Schwab, PhD [address] Arlis Porta [place], [place]   [phone] or [phone]   [place], [place] [phone]   [place] [address] [place], [place] [phone] Insurance/Medicaid/sponsorship through [employer] and Families [address] [place] [address]  [place], [place] [phone] [place], [place] [phone]    Dr. Adele Schilder  [phone]   [facility]  Dept. 1) [address] [place] 2) [place] 3)  [place] [address], [place] [phone]  [phone]   [place] [phone] or [phone] (After Hours)      You have been diagnosed by your caregiver as having chest wall pain. SEEK IMMEDIATE MEDICAL ATTENTION IF: You develop a fever.  Your chest pains become severe or intolerable.  You develop new, unexplained symptoms (problems).  You develop shortness of breath, nausea, vomiting, sweating or feel light headed.  You develop a new cough or you cough up blood. Your caregiver has diagnosed you as having chest pain that is not specific for one problem, but does not require admission.  You are at low risk for an acute heart condition or other serious illness. Chest pain comes from many different causes.  SEEK IMMEDIATE MEDICAL ATTENTION IF: You have severe chest pain, especially if the pain is crushing or pressure-like and spreads to the arms, back, neck, or jaw, or if you have sweating, nausea (feeling sick to your stomach), or shortness of breath. THIS IS AN EMERGENCY. Don't wait to see if the pain will go away. Get medical help at once. Call 911 or 0 (operator). DO NOT drive yourself to the hospital.  Your chest pain gets worse and does not go away with rest.  You have an attack of chest pain lasting longer than usual, despite rest and treatment with the medications your caregiver has prescribed.  You wake from sleep with chest pain or shortness of breath.  You feel dizzy or faint.  You have chest pain not typical of your usual pain for which you originally saw your caregiver.

## 2013-10-21 NOTE — ED Notes (Signed)
Pt now reports increase in left sided chest pain. States "it just came back." Now rates 8/10. Vitals remain stable.

## 2013-10-21 NOTE — ED Notes (Signed)
MD Bednar made aware of pt's continued 9/10 pain.

## 2013-10-21 NOTE — ED Notes (Signed)
EMS gave  Zofran via IV.

## 2013-10-21 NOTE — ED Provider Notes (Signed)
Chief Complaint   Chief Complaint  Patient presents with  . Chest Pain    History of Present Illness    Katherine [name] is a 48 year-old [place] female. History was obtained with the help of a telephone interpreter. She presents with a history since 8:30 this morning of severe left lateral chest pain. This is nonpruritic. It does not radiate. It has been associated with shortness of breath, nausea, and diaphoresis. 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 She denies any associated fever, cough, hemoptysis, or GI symptoms. She has no cardiac history. No history of high blood pressure, diabetes, elevated cholesterol, and she is a former cigarette smoker. She has had a one-month history of pelvic pain and vaginal bleeding and is currently being evaluated for this.  Review of Systems    Other than noted above, the patient denies any of the following symptoms. Systemic:  No fever or chills. Pulmonary:  No cough, wheezing, sputum production, hemoptysis. Cardiac:  No palpitations, rapid heartbeat, dizziness, presyncope or syncope. GI:  No heartburn, nausea, or vomiting. Ext:  No leg pain or swelling.  [place]    Past medical history, family history, social history, meds, and allergies were reviewed. She is on Megace for vaginal bleeding.  Physical Exam     Vital signs:  BP 157/93  Pulse 83  Temp(Src) 97.9 F (36.6 C) (Oral)  Resp 18  SpO2 100%  LMP 09/22/2013 Gen:  Alert, oriented, she is moaning, crying, and rocking back and forth. Eye:  PERRL, lids and conjunctivas normal.  Sclera non-icteric. ENT:  Mucous membranes moist, pharynx clear. Neck:  Supple, no adenopathy or tenderness.  No JVD. Lungs:  Clear to auscultation, no wheezes, rales or rhonchi.  No respiratory distress. Heart:  Regular rhythm.  No gallops, murmers, clicks or rubs. Chest:  There is chest wall tenderness to palpation over the lateral chest. Abdomen:  Soft, there is mild tenderness to palpation lower abdomen, no  organomegaly or mass.  Bowel sounds normal.  No pulsatile abdominal mass or bruit. Ext:  No edema.  No calf tenderness and Homann's sign negative.  Pulses full and equal. Skin:  Warm and dry.  No rash.   Electrocardiogram     Date: 10/21/2013  Rate: 71  Rhythm: normal sinus rhythm and sinus arrhythmia  QRS Axis: normal  Intervals: normal  ST/T Wave abnormalities: normal  Conduction Disutrbances:none  Narrative Interpretation: Normal sinus rhythm with sinus arrhythmia, otherwise normal.  Old EKG Reviewed: none available  Course in Urgent [place]         She was given aspirin 325 mg by mouth, nitroglycerin 0.4 mg sublingually, Dilaudid 1 mg IM, and Zofran 4 mg intravenously. An IV of normal saline was started at 50 mL per hour, she was given oxygen at 2 L per minute by nasal cannula, and placed on a monitor.  Assessment     The encounter diagnosis was Chest pain.  Plan    The patient was transferred to the ED via EMS in stable condition.  Medical Decision Making   48 year old [place] female had onset of severe left lateral chest pain this morning at 8:30 a.m. Associated with nausea, shortness of breath and diaphoresis.  Denies pleuritic pain, cough or fever.  No previous cardiac history.  She is moaning and crying with pain.  EKG is normal.  She also has a 1 month history of pelvic pain and vaginal bleeding.  We will give TNG, O2, IV NS, ASA, Dilaudid, and Zofran.         Harden Mo, MD 10/21/13 [id number]

## 2013-10-21 NOTE — ED Notes (Signed)
Per EMS pt from urgent care with c/o chest pain today at 830. Pt holding left side of chest. Pt given aspirin 324mg  PO, zofran 4mg  IVP, dilaudid 1mg  IM, nitro x 1 SL. IV 20G RAC. Shortness of breath and vomiting.

## 2013-10-21 NOTE — ED Provider Notes (Signed)
CSN: [id number]     Arrival date & time 10/21/13  2025 History   First MD Initiated Contact with Patient 10/21/13 1001     Chief Complaint  Patient presents with  . Chest Pain     (Consider location/radiation/quality/duration/timing/severity/associated sxs/prior Treatment) HPI This 48 year old female presents with sudden onset sharp stabbing nonradiating left chest pain with shortness of breath worse with breathing worse with palpation worse with torso movement with no cough no abdominal pain no vomiting no fever no recent travel no trauma no history of blood clots heart attacks or cancer. She has a one-month history of abnormal vaginal bleeding with constant pelvic pain for the last month followed by gynecology with ultrasound revealing fibroids is most likely cause of patient's abnormal vaginal bleeding and she is on hormone therapy for that in the vaginal bleeding and abdominal pain for the last month are not the reason for today's ED visit. She was at work today where chest pain started she was taken to the urgent care she was given aspirin nitroglycerin Zofran and Dilaudid which greatly improved her pain her pain is now mild that was sudden onset sharp stabbing severe but is now mild. EKG from EMS as well as EKG from urgent care where patient was seen prior to transfer to the emergency department revealed sinus rhythm with slight nondiagnostic ST elevation inferolateral leads without significant change from April 2014 EKG comparison. Past Medical History  Diagnosis Date  . Wears glasses   . Pelvic pain   . DUB (dysfunctional uterine bleeding)    Past Surgical History  Procedure Laterality Date  . Abdominal surgery    . Dilation and curettage of uterus    . Endometrial ablation  2010    same surgery 3 times   . Tubal ligation     No family history on file. History  Substance Use Topics  . Smoking status: Former Research scientist (life sciences)  . Smokeless tobacco: Current User  . Alcohol Use: No   OB  History   Grav Para Term Preterm Abortions TAB SAB Ect Mult Living   5 5 5       3      Review of Systems  10 Systems reviewed and are negative for acute change except as noted in the HPI.  Allergies  Review of patient's allergies indicates no known allergies.  Home Medications   Current Outpatient Rx  Name  Route  Sig  Dispense  Refill  . diclofenac (VOLTAREN) 75 MG EC tablet   Oral   Take 1 tablet (75 mg total) by mouth 2 (two) times daily with a meal.   60 tablet   2   . HYDROcodone-acetaminophen (NORCO/VICODIN) 5-325 MG per tablet   Oral   Take 1-2 tablets by mouth every 4 (four) hours as needed.   20 tablet   0   . ibuprofen (ADVIL,MOTRIN) 600 MG tablet   Oral   Take 600 mg by mouth every 8 (eight) hours as needed (pain).         Marland Kitchen HYDROcodone-acetaminophen (NORCO) 5-325 MG per tablet   Oral   Take 2 tablets by mouth every 6 (six) hours as needed for severe pain.   10 tablet   0   . megestrol (MEGACE) 40 MG tablet   Oral   Take 1 tablet (40 mg total) by mouth 2 (two) times daily.   60 tablet   5    BP 133/87  Pulse 58  Resp 17  SpO2 100%  LMP  09/22/2013 Physical Exam  Nursing note and vitals reviewed. Constitutional:  Awake, alert, nontoxic appearance.  HENT:  Head: Atraumatic.  Eyes: Right eye exhibits no discharge. Left eye exhibits no discharge.  Neck: Neck supple.  Cardiovascular: Normal rate and regular rhythm.   No murmur heard. Pulmonary/Chest: Effort normal and breath sounds normal. No respiratory distress. She has no wheezes. She has no rales. She exhibits tenderness.  Reproducible left anterior lower chest wall tenderness without rash or chest wall deformity noted  Abdominal: Soft. Bowel sounds are normal. She exhibits no distension and no mass. There is tenderness. There is no rebound and no guarding.  Mild suprapubic tenderness which patient states is baseline for her since she has had chronic abdominal pain and vaginal bleeding for the  last month  Musculoskeletal: She exhibits no edema and no tenderness.  Baseline ROM, no obvious new focal weakness.  Neurological: She is alert.  Mental status and motor strength appears baseline for patient and situation.  Skin: No rash noted.  Psychiatric: She has a normal mood and affect.    ED Course  Procedures (including critical care time) Patient understand and agree with initial ED impression and plan with expectations set for ED visit. Upon arrival to the emergency department the patient had serial ECGs with Muse not working;  1005: Sinus rhythm, ventricular rate 70, normal axis, normal intervals, slight nondiagnostic ST elevation inferolateral leads without significant change noted compared with April 2014 or ECG from EMS prior to arrival or ECG from urgent care prior to arrival which were obtained at 916 and 941 this morning 1017: Sinus rhythm, ventricular rate 68, normal axis, normal intervals, slight ST elevation inferolateral leads subtle nondiagnostic without significant change noted compared with previous ECGs 1212: Sinus rhythm, ventricular rate 61, normal axis, slight nondiagnostic ST elevation inferolateral leads without significant change noted compared with previous ECGs Pt stable in ED with no significant deterioration in condition.Patient / Family / Caregiver informed of clinical course, understand medical decision-making process, and agree with plan.  Labs Review Labs Reviewed  CBC WITH DIFFERENTIAL - Abnormal; Notable for the following:    WBC 10.6 (*)    All other components within normal limits  BASIC METABOLIC PANEL - Abnormal; Notable for the following:    Potassium 3.5 (*)    Glucose, Bld 101 (*)    All other components within normal limits  D-DIMER, QUANTITATIVE  I-STAT TROPOININ, ED  I-STAT TROPOININ, ED  I-STAT TROPOININ, ED   Imaging Review No results found.   EKG Interpretation   Date/Time:  Monday October 21 2013 12:12:13 EDT Ventricular Rate:   61 PR Interval:  159 QRS Duration: 90 QT Interval:  426 QTC Calculation: 429 R Axis:   65 Text Interpretation:  Sinus rhythm ST elev, probable normal early repol  pattern No significant change since last tracing Confirmed by [facility]  MD,  Jenny Reichmann ([id number]) on 10/21/2013 12:19:15 PM      MDM   Final diagnoses:  Chest wall pain    I doubt any other EMC precluding discharge at this time including, but not necessarily limited to the following:ACS, PE.    Babette Relic, MD 10/23/13 1225

## 2013-10-22 MED ORDER — MEGESTROL ACETATE 40 MG PO TABS: 40.0000 mg | ORAL_TABLET | Freq: Two times a day (BID) | ORAL | Status: DC

## 2013-10-22 NOTE — Progress Notes (Signed)
Called pt. With [facility] interpreter [phone]. Called mobile number. No answer and unable to leave message as no voicemail box set up. Called home phone. Spoke to pt and informed her of Megace prescription to stop bleeding; informed pt. It has been sent to her [place] on [address]. Pt. Verbalized understanding. Also informed pt. That she is not anemic and that she can return to work; light duty for a few weeks but she does not need to remain on light duty for the next 9 months and we certainly cannot write her a letter for light duty for 9 months. Pt. States she is still bleeding and having pelvic pain; informed pt. That the Megace will stop her bleeding. Pt.Tearful/audible crying heard over the phone and states "I have had to have surgery every year and still I have pain and am not fixed." Explained to pt. That because the surgery could not be performed the Megace has been prescribed in its place which should help stop pt.'s bleeding and symptoms. Pt. Verbalized understanding but expressed concern and stress addressing her financial burden, states "my insurance will have 0 co pay at any other hospital but [facility] not [facility]." Explained to pt. That I can send her a hospital financial aid packet to fill out and mail to address on last page; it will be reviewed and if she is a candidate it may help pay a portion of her bills. Pt. Verbalized understanding and stated she would like it mailed because her neighbors speak english and can help her fill it out. Informed pt. That I would mail it out today and that she can fill it out and return it to the hospital. Re-itterrated for pt. To start taking megace twice a day and it will help alleviate symptoms of bleeding and pain. Pt. Verbalized understanding. Pt. Asked about letter for light duty. Informed pt. I will send her a letter to give to her work stating she can perform light duty for the next 3 weeks; informed her we will not right her  for light duty for any longer than that. Pt. Verbalized gratitude. Financial aid paperwork and letter for work to be sent out by front office.

## 2013-10-24 ENCOUNTER — Ambulatory Visit (HOSPITAL_COMMUNITY)
Admission: RE | Admit: 2013-10-24 | Discharge: 2013-10-24 | Disposition: A | Discharge status: Home / Self Care | Payer: No Typology Code available for payment source | Source: Ambulatory Visit | Attending: Family Medicine | Admitting: Family Medicine

## 2013-10-24 ENCOUNTER — Ambulatory Visit (INDEPENDENT_AMBULATORY_CARE_PROVIDER_SITE_OTHER): Payer: No Typology Code available for payment source | Admitting: Medical

## 2013-10-24 ENCOUNTER — Encounter: Payer: Self-pay | Admitting: Medical

## 2013-10-24 VITALS — BP 120/78 | HR 68 | Temp 98.2°F | Resp 24 | Wt 118.0 lb

## 2013-10-24 DIAGNOSIS — N949 Unspecified condition associated with female genital organs and menstrual cycle: Secondary | ICD-10-CM

## 2013-10-24 DIAGNOSIS — Z1231 Encounter for screening mammogram for malignant neoplasm of breast: Secondary | ICD-10-CM | POA: Insufficient documentation

## 2013-10-24 DIAGNOSIS — Z1239 Encounter for other screening for malignant neoplasm of breast: Secondary | ICD-10-CM

## 2013-10-24 DIAGNOSIS — R7301 Impaired fasting glucose: Secondary | ICD-10-CM

## 2013-10-24 DIAGNOSIS — N938 Other specified abnormal uterine and vaginal bleeding: Secondary | ICD-10-CM

## 2013-10-24 DIAGNOSIS — R110 Nausea: Secondary | ICD-10-CM

## 2013-10-24 DIAGNOSIS — R102 Pelvic and perineal pain: Secondary | ICD-10-CM

## 2013-10-24 DIAGNOSIS — R079 Chest pain, unspecified: Secondary | ICD-10-CM

## 2013-10-24 LAB — POCT CBG (FASTING - GLUCOSE)-MANUAL ENTRY

## 2013-10-24 LAB — POCT GLYCOSYLATED HEMOGLOBIN (HGB A1C): Hemoglobin A1C: 5.9

## 2013-10-24 MED ORDER — IBUPROFEN 600 MG PO TABS: 600.0000 mg | ORAL_TABLET | Freq: Three times a day (TID) | ORAL | Status: DC | PRN

## 2013-10-24 MED ORDER — ONDANSETRON HCL 4 MG PO TABS: 4.0000 mg | ORAL_TABLET | Freq: Three times a day (TID) | ORAL | Status: DC | PRN

## 2013-10-24 NOTE — Progress Notes (Signed)
Subjective:   Katherine [name] is a 48 y.o. female presenting on 10/24/2013 with Follow-up  Here with translator today.  I saw her recently for pelvic pain, heavy periods.  At last visit the way we left things was that she was to followup with Dr. Kennon Rounds.  Since last visit she was apparently given a few weeks out of work, but told to come back here for prolonged out of work not.  She was given phone numbers to a clinic in [facility], however she seems to be getting nowhere with a gynecologist to help her with the surgery and costs.  She seems to be frustrated   She went to the emergency department recently for severe left-sided chest pain, had a whole bunch of tests, no cause was found, except her sugar was elevated.  Advised she follow up with me regarding her sugar.  Since the ED visit she has had occasional SOB, occasional chest pain.  No leg swelling, the pain is worse with motion or activity.  She continues to have continual pelvic pain and vaginal bleeding every day.  No other aggravating or relieving factors.  No other complaint.   Review of Systems ROS as in subjective      Objective:     Filed Vitals:   10/24/13 1134  BP: 120/78  Pulse: 68  Temp: 98.2 F (36.8 C)  Resp: 24    General appearance: alert, no distress, WD/WN HEENT: normocephalic, sclerae anicteric, TMs pearly, nares patent, no discharge or erythema, pharynx normal Oral cavity: MMM, no lesions Neck: supple, no lymphadenopathy, no thyromegaly, no masses Heart: RRR, normal S1, S2, no murmurs Lungs: CTA bilaterally, no wheezes, rhonchi, or rales Chest wall tender over left anterior chest wall, otherwise normal inspiration and expiration otherwise nontender Back tender in paraspinal region mid to upper back, range of motion Abdomen: +bs, soft, moderate generalized lower abdominal tenderness, non distended, no masses, no hepatomegaly, no splenomegaly Pulses: 2+ symmetric, upper and lower extremities, normal cap  refill Extremities no edema      Assessment: Encounter Diagnoses  Name Primary?  . Chest pain Yes  . Impaired fasting blood sugar   . Pelvic pain   . Dysfunctional uterine bleeding   . Nausea alone      Plan: Impaired glucose on recent labs through Emergency Dept - HgbA1C today 5.9%, glucose fingerstick nonfasting today 99.   Briefly discussed diet, lab findings   Pelvic pain -I saw her recently for the same concern, and I ended up calling gynecology and deferring back to them since she is requesting FMLA and time out of work.  Her main predicament is that she does not have the finances to proceed with the hysterectomy which she needs at this time.  She states today that she was given 3 weeks out of work by gynecology but then they advised her to go ahead and make appointment here to give for a prolonged period of time out of work which I cannot do. She apparently was given phone numbers to clinics in [facility] to see if someone would do the surgery cheaper there.  2 offices she called will not work with her, the other number she was given was the Gen. switchboard [facility] which has not been helpful for her. unfortunately this process has been frustrating for her.  We will try to get a social worker involved to try and come up with a plan, ultimately she needs to have hysterectomy and someone will need  to work with her on costs/finances.  Chest pain - musculoskeletal given findings today.  I reviewed her recent emergency department notes, chest x-ray, labs.  Short term prescription for Zofran and ibuprofen when necessary   Katherine [name] was seen today for follow-up.  Diagnoses and associated orders for this visit:  Chest pain  Impaired fasting blood sugar - POCT glycosylated hemoglobin (Hb A1C) - POCT CBG (Fasting - Glucose)  Pelvic pain  Dysfunctional uterine bleeding    Return f/u with gyn/[facility].

## 2013-10-28 ENCOUNTER — Telehealth: Payer: Self-pay | Admitting: Family Medicine

## 2013-10-28 NOTE — Telephone Encounter (Signed)
I have reviewed chart as you asked for notes from Dr. Kennon Rounds and they are in [facility].  Also called [facility] [phone] t/w Ivin Booty, she states that patient has never been seen there before.  They are also in [place] per [place].   I called Social Worker [phone] Toney Reil and have left message.

## 2013-10-28 NOTE — Telephone Encounter (Signed)
Are you talking about the note from mid march before I saw her or are their other notes in there?

## 2013-11-06 NOTE — Telephone Encounter (Signed)
Called Diane Mare Ferrari the Education officer, museum.  I spoke with Diane she states she is the Network engineer for the Education officer, museum.  So, she connected me with Charlene Brooke which is a Education officer, museum.  She is in with a patient and will call me back.

## 2013-11-06 NOTE — Telephone Encounter (Signed)
At this point, she needs to turn in her packet with [facility] and see where that goes.  They can then touch base with [place].   But I recommend whoever they are talking to be given our contact info for coordination of care.

## 2013-11-06 NOTE — Telephone Encounter (Signed)
Doris the Education officer, museum called and then she switched me to [place] the nurse and she states that they cannot help.  That they could send her to a financial counselor and I advised the OBGYN has had a financial packet sent to pt. Audelia Acton, please advise what further can be done.

## 2013-11-12 NOTE — Telephone Encounter (Signed)
We are listed as her primary care provider.

## 2013-11-15 ENCOUNTER — Telehealth: Payer: Self-pay | Admitting: Internal Medicine

## 2013-11-15 NOTE — Telephone Encounter (Signed)
Faxed over medical records to [facility] on April 15 @ [phone]

## 2013-11-18 ENCOUNTER — Encounter: Payer: Self-pay | Admitting: *Deleted

## 2013-11-27 ENCOUNTER — Encounter: Payer: Self-pay | Admitting: Medical

## 2013-11-27 ENCOUNTER — Ambulatory Visit (INDEPENDENT_AMBULATORY_CARE_PROVIDER_SITE_OTHER): Payer: No Typology Code available for payment source | Admitting: Medical

## 2013-11-27 VITALS — BP 110/80 | HR 78 | Temp 98.3°F | Resp 16 | Wt 117.0 lb

## 2013-11-27 DIAGNOSIS — J988 Other specified respiratory disorders: Secondary | ICD-10-CM

## 2013-11-27 DIAGNOSIS — R490 Dysphonia: Secondary | ICD-10-CM

## 2013-11-27 DIAGNOSIS — R0602 Shortness of breath: Secondary | ICD-10-CM

## 2013-11-27 MED ORDER — CLARITHROMYCIN 500 MG PO TABS: 500.0000 mg | ORAL_TABLET | Freq: Two times a day (BID) | ORAL | Status: DC

## 2013-11-27 MED ORDER — HYDROCODONE-HOMATROPINE 5-1.5 MG/5ML PO SYRP: 5.0000 mL | ORAL_SOLUTION | Freq: Three times a day (TID) | ORAL | Status: DC | PRN

## 2013-11-27 NOTE — Progress Notes (Signed)
Subjective:  Katherine [name] is a 48 y.o. female who presents for cough and hoarseness.  Here today with son who translates. Son in college in [place] for [employer].  Symptoms include 1 wk of post nasal drainage, hoarseness, but 2 days of terrible abrupt onset of cough, SOB, fatigue, not feeling well, congestion.   Denies ear pain, chest pain, sinus pressure, nausea, vomiting, fever.  Using nothing for symptoms.  She does not smoke.   She does not have a history of bronchitis, TB, pneumonia.   No other aggravating or relieving factors.  No other c/o.  The following portions of the patient's history were reviewed and updated as appropriate: allergies, current medications, past family history, past medical history, past social history, past surgical history and problem list.  ROS as in subjective  Past Medical History  Diagnosis Date  . Wears glasses   . Pelvic pain   . DUB (dysfunctional uterine bleeding)      Objective: BP 110/80  Pulse 78  Temp(Src) 98.3 F (36.8 C) (Oral)  Resp 16  Wt 117 lb (53.071 kg)   General appearance: Alert, WD/WN, no distress, ill appearing                             Skin: warm, no rash, no diaphoresis                           Head: no sinus tenderness                            Eyes: conjunctiva normal, corneas clear, PERRLA                            Ears: pearly TMs, external ear canals normal                          Nose: septum midline, turbinates swollen, with erythema and clear discharge             Mouth/throat: MMM, tongue normal, mild pharyngeal erythema                           Neck: supple, no adenopathy, no thyromegaly, nontender                          Heart: RRR, normal S1, S2, no murmurs                         Lungs: +bronchial breath sounds, +scattered rhonchi, no wheezes, no rales                Extremities: no edema, nontender     Assessment: Encounter Diagnoses  Name Primary?  Marland Kitchen Respiratory infection Yes  . SOB (shortness  of breath)   . Hoarseness       Plan:  Medication orders today include: Biaxin, hycodan syrup.  Discussed diagnosis and treatment of bronchitis.  Suggested symptomatic OTC remedies for cough and congestion.  Tylenol or Ibuprofen OTC for fever and malaise.  Call/return in 2-3 days if symptoms are worse or not improving.  Advised that cough may linger even after the infection is improved.

## 2013-11-29 ENCOUNTER — Encounter (HOSPITAL_COMMUNITY): Payer: Self-pay | Admitting: Emergency Medicine

## 2013-11-29 ENCOUNTER — Emergency Department (HOSPITAL_COMMUNITY)
Admission: EM | Admit: 2013-11-29 | Discharge: 2013-11-29 | Disposition: A | Discharge status: Home / Self Care | Payer: No Typology Code available for payment source | Attending: Emergency Medicine | Admitting: Emergency Medicine

## 2013-11-29 ENCOUNTER — Emergency Department (HOSPITAL_COMMUNITY): Payer: No Typology Code available for payment source

## 2013-11-29 DIAGNOSIS — R109 Unspecified abdominal pain: Secondary | ICD-10-CM

## 2013-11-29 DIAGNOSIS — R112 Nausea with vomiting, unspecified: Secondary | ICD-10-CM | POA: Insufficient documentation

## 2013-11-29 DIAGNOSIS — R1032 Left lower quadrant pain: Secondary | ICD-10-CM | POA: Insufficient documentation

## 2013-11-29 DIAGNOSIS — R141 Gas pain: Secondary | ICD-10-CM | POA: Insufficient documentation

## 2013-11-29 DIAGNOSIS — Z87891 Personal history of nicotine dependence: Secondary | ICD-10-CM | POA: Insufficient documentation

## 2013-11-29 DIAGNOSIS — R1031 Right lower quadrant pain: Secondary | ICD-10-CM | POA: Insufficient documentation

## 2013-11-29 DIAGNOSIS — Z792 Long term (current) use of antibiotics: Secondary | ICD-10-CM | POA: Insufficient documentation

## 2013-11-29 DIAGNOSIS — R143 Flatulence: Secondary | ICD-10-CM

## 2013-11-29 DIAGNOSIS — Z8742 Personal history of other diseases of the female genital tract: Secondary | ICD-10-CM | POA: Insufficient documentation

## 2013-11-29 DIAGNOSIS — R142 Eructation: Secondary | ICD-10-CM

## 2013-11-29 LAB — CBC WITH DIFFERENTIAL/PLATELET
BASOS ABS: 0 10*3/uL (ref 0.0–0.1)
BASOS PCT: 0 % (ref 0–1)
Eosinophils Absolute: 0.2 10*3/uL (ref 0.0–0.7)
Eosinophils Relative: 2 % (ref 0–5)
HCT: 37.8 % (ref 36.0–46.0)
Hemoglobin: 12.9 g/dL (ref 12.0–15.0)
Lymphocytes Relative: 30 % (ref 12–46)
Lymphs Abs: 2.9 10*3/uL (ref 0.7–4.0)
MCH: 30.1 pg (ref 26.0–34.0)
MCHC: 34.1 g/dL (ref 30.0–36.0)
MCV: 88.1 fL (ref 78.0–100.0)
MONO ABS: 0.4 10*3/uL (ref 0.1–1.0)
Monocytes Relative: 5 % (ref 3–12)
NEUTROS ABS: 6.2 10*3/uL (ref 1.7–7.7)
Neutrophils Relative %: 63 % (ref 43–77)
Platelets: 204 10*3/uL (ref 150–400)
RBC: 4.29 MIL/uL (ref 3.87–5.11)
RDW: 12.5 % (ref 11.5–15.5)
WBC: 9.8 10*3/uL (ref 4.0–10.5)

## 2013-11-29 LAB — COMPREHENSIVE METABOLIC PANEL
ALBUMIN: 3.9 g/dL (ref 3.5–5.2)
ALT: 18 U/L (ref 0–35)
AST: 20 U/L (ref 0–37)
Alkaline Phosphatase: 85 U/L (ref 39–117)
BUN: 9 mg/dL (ref 6–23)
CO2: 22 meq/L (ref 19–32)
CREATININE: 0.72 mg/dL (ref 0.50–1.10)
Calcium: 10.4 mg/dL (ref 8.4–10.5)
Chloride: 101 mEq/L (ref 96–112)
GFR calc Af Amer: >90 mL/min (ref >90)
Glucose, Bld: 100 mg/dL — ABNORMAL HIGH (ref 70–99)
Potassium: 3.7 mEq/L (ref 3.7–5.3)
SODIUM: 139 meq/L (ref 137–147)
Total Bilirubin: 1.2 mg/dL (ref 0.3–1.2)
Total Protein: 7.4 g/dL (ref 6.0–8.3)

## 2013-11-29 LAB — URINALYSIS, ROUTINE W REFLEX MICROSCOPIC
BILIRUBIN URINE: NEGATIVE
GLUCOSE, UA: NEGATIVE mg/dL
HGB URINE DIPSTICK: NEGATIVE
Ketones, ur: 15 mg/dL — AB
Nitrite: NEGATIVE
Protein, ur: NEGATIVE mg/dL
SPECIFIC GRAVITY, URINE: 1.012 (ref 1.005–1.030)
Urobilinogen, UA: 1 mg/dL (ref 0.0–1.0)
pH: 6.5 (ref 5.0–8.0)

## 2013-11-29 LAB — URINE MICROSCOPIC-ADD ON

## 2013-11-29 MED ORDER — MORPHINE SULFATE 4 MG/ML IJ SOLN
4.0000 mg | Freq: Once | INTRAMUSCULAR | Status: AC
  Administered 2013-11-29: 4 mg via INTRAVENOUS
  Filled 2013-11-29: qty 1

## 2013-11-29 MED ORDER — ONDANSETRON 4 MG PO TBDP
8.0000 mg | ORAL_TABLET | Freq: Once | ORAL | Status: AC
  Administered 2013-11-29: 8 mg via ORAL
  Filled 2013-11-29: qty 2

## 2013-11-29 MED ORDER — IOHEXOL 300 MG/ML  SOLN: 25.0000 mL | INTRAMUSCULAR | Status: AC

## 2013-11-29 MED ORDER — PROMETHAZINE HCL 25 MG PO TABS: 25.0000 mg | ORAL_TABLET | Freq: Four times a day (QID) | ORAL | Status: DC | PRN

## 2013-11-29 MED ORDER — SODIUM CHLORIDE 0.9 % IV BOLUS (SEPSIS)
1000.0000 mL | Freq: Once | INTRAVENOUS | Status: AC
  Administered 2013-11-29: 1000 mL via INTRAVENOUS

## 2013-11-29 MED ORDER — PROMETHAZINE HCL 25 MG/ML IJ SOLN
25.0000 mg | Freq: Once | INTRAMUSCULAR | Status: AC
Start: 2013-11-29 — End: 2013-11-29
  Administered 2013-11-29: 25 mg via INTRAVENOUS
  Filled 2013-11-29: qty 1

## 2013-11-29 MED ORDER — IOHEXOL 300 MG/ML  SOLN
100.0000 mL | Freq: Once | INTRAMUSCULAR | Status: AC | PRN
  Administered 2013-11-29: 100 mL via INTRAVENOUS

## 2013-11-29 NOTE — ED Provider Notes (Signed)
CSN: [id number]     Arrival date & time 11/29/13  1412 History   First MD Initiated Contact with Patient 11/29/13 1500     Chief Complaint  Patient presents with  . Emesis     (Consider location/radiation/quality/duration/timing/severity/associated sxs/prior Treatment) HPI Comments: Pt is a non english speaking pt who has hx of D and C, endometrial ablation and BTL, she reports that she has had a cough for several days - was treated with biaxin and a cough medicine containing hydrocodone which has resolved the cough.  She has since developed n/v in the last 24 hours.  This is severe, persistent and nothing makes better or worse.  No diarrhea, but has abd pain in the lower abd across both RLQ and LLQ.  She has no fever, no headache, no back pain, no dysuria. She has had no medication prior to arrival for her nausea.  Patient is a 48 y.o. female presenting with vomiting. The history is provided by the patient and a relative.  Emesis   Past Medical History  Diagnosis Date  . Wears glasses   . Pelvic pain   . DUB (dysfunctional uterine bleeding)    Past Surgical History  Procedure Laterality Date  . Abdominal surgery    . Dilation and curettage of uterus    . Endometrial ablation  2010    same surgery 3 times   . Tubal ligation     History reviewed. No pertinent family history. History  Substance Use Topics  . Smoking status: Former Research scientist (life sciences)  . Smokeless tobacco: Current User  . Alcohol Use: No   OB History   Grav Para Term Preterm Abortions TAB SAB Ect Mult Living   5 5 5       3      Review of Systems  Gastrointestinal: Positive for vomiting.  All other systems reviewed and are negative.     Allergies  Zofran  Home Medications   Prior to Admission medications   Medication Sig Start Date End Date Taking? Authorizing Provider  clarithromycin (BIAXIN) 500 MG tablet Take 1 tablet (500 mg total) by mouth 2 (two) times daily. 11/27/13  Yes Camelia Eng Tysinger, PA-C   HYDROcodone-homatropine (HYCODAN) 5-1.5 MG/5ML syrup Take 5 mLs by mouth every 8 (eight) hours as needed for cough. 11/27/13  Yes Camelia Eng Tysinger, PA-C  ibuprofen (ADVIL,MOTRIN) 600 MG tablet Take 1 tablet (600 mg total) by mouth every 8 (eight) hours as needed (pain). 10/24/13  Yes David S Tysinger, PA-C   BP 113/74  Pulse 64  Temp(Src) 98 F (36.7 C) (Rectal)  Resp 16  SpO2 97% Physical Exam  Nursing note and vitals reviewed. Constitutional: She appears well-developed and well-nourished. No distress.  HENT:  Head: Normocephalic and atraumatic.  Mouth/Throat: No oropharyngeal exudate.  MM dry  Eyes: Conjunctivae and EOM are normal. Pupils are equal, round, and reactive to light. Right eye exhibits no discharge. Left eye exhibits no discharge. No scleral icterus.  Neck: Normal range of motion. Neck supple. No JVD present. No thyromegaly present.  Cardiovascular: Normal rate, regular rhythm, normal heart sounds and intact distal pulses.  Exam reveals no gallop and no friction rub.   No murmur heard. Pulmonary/Chest: Effort normal and breath sounds normal. No respiratory distress. She has no wheezes. She has no rales.  Abdominal: Soft. Bowel sounds are normal. She exhibits distension ( mild ). She exhibits no mass. There is tenderness ( Bilateral lower abdominal tenderness right greater than left).  Musculoskeletal: Normal range  of motion. She exhibits no edema and no tenderness.  Lymphadenopathy:    She has no cervical adenopathy.  Neurological: She is alert. Coordination normal.  Skin: Skin is warm and dry. No rash noted. No erythema.  Psychiatric: She has a normal mood and affect. Her behavior is normal.    ED Course  Procedures (including critical care time) Labs Review Labs Reviewed  COMPREHENSIVE METABOLIC PANEL - Abnormal; Notable for the following:    Glucose, Bld 100 (*)    All other components within normal limits  URINALYSIS, ROUTINE W REFLEX MICROSCOPIC - Abnormal;  Notable for the following:    APPearance CLOUDY (*)    Ketones, ur 15 (*)    Leukocytes, UA TRACE (*)    All other components within normal limits  URINE MICROSCOPIC-ADD ON - Abnormal; Notable for the following:    Squamous Epithelial / LPF MANY (*)    All other components within normal limits  CBC WITH DIFFERENTIAL    Imaging Review Ct Abdomen Pelvis W Contrast  11/29/2013   CLINICAL DATA:  Abdominal distention.  Hematuria.  EXAM: CT ABDOMEN AND PELVIS WITH CONTRAST  TECHNIQUE: Multidetector CT imaging of the abdomen and pelvis was performed using the standard protocol following bolus administration of intravenous contrast.  CONTRAST:  148mL OMNIPAQUE IOHEXOL 300 MG/ML  SOLN  COMPARISON:  US TRANSVAGINAL NON-OB dated 09/22/2013; CT ABD/PELVIS W CM dated 07/23/2010  FINDINGS: Bones:  No aggressive osseous lesion.  No fracture.  Lung Bases: Dependent atelectasis.  Liver: Normal allowing for artifact from the patient's arms being at the side.  Spleen:  Normal.  Gallbladder:  Normal.  Common bile duct:  Normal.  Pancreas:  Normal.  Adrenal glands:  Normal bilaterally.  Kidneys: Normal enhancement. Normal delayed excretion of contrast. Both ureters appear normal.  Stomach:  Normal.  Small bowel: Normal. No mesenteric adenopathy or inflammatory changes. No obstruction.  Colon:   Normal appendix.  Distal colon decompressed.  Pelvic Genitourinary: Physiologic appearance of the uterus and adnexa. Distended urinary bladder. No free fluid.  Vasculature: Normal.  Body Wall: Normal.  IMPRESSION: No acute abnormality.   Electronically Signed   By: Dereck Ligas M.D.   On: 11/29/2013 23:03   Dg Chest Port 1 View  11/29/2013   CLINICAL DATA:  SOB  EXAM: PORTABLE CHEST - 1 VIEW  COMPARISON:  DG CHEST 2 VIEW dated 10/21/2013  FINDINGS: The heart size and mediastinal contours are within normal limits. Both lungs are clear. The visualized skeletal structures are unremarkable.  IMPRESSION: No active disease.    Electronically Signed   By: Margaree Mackintosh M.D.   On: 11/29/2013 18:40      MDM   Final diagnoses:  Abdominal pain  Nausea and vomiting    The patient appears dehydrated, her vital signs however are very normal, she has no stiff neck, lungs and heart are clear without tachycardia and she does have bilateral lower abdominal tenderness. With her prior surgeries there would be some concern for bowel obstruction though this could also just be related to the cough medicine containing opiate medications which could also make her nauseated. We'll perform a pelvic exam as the patient does have a history of excessive vaginal bleeding and her son states that she has been bleeding since February vaginal A., this stopped last night and she has had no bleeding today.  CT scan is negative for any acute findings. Her lab work is unremarkable, vital signs are unremarkable, tolerated over 500 cc of oral  fluids for CT contrast and will ambulate without difficulty. She appears stable for discharge, family informed of the results, they will followup as an outpatient.  Meds given in ED:  Medications  iohexol (OMNIPAQUE) 300 MG/ML solution 25 mL (not administered)  ondansetron (ZOFRAN-ODT) disintegrating tablet 8 mg (8 mg Oral Given 11/29/13 1434)  sodium chloride 0.9 % bolus 1,000 mL (1,000 mLs Intravenous New Bag/Given 11/29/13 2007)  sodium chloride 0.9 % bolus 1,000 mL (0 mLs Intravenous Stopped 11/29/13 2018)  morphine 4 MG/ML injection 4 mg (4 mg Intravenous Given 11/29/13 1845)  promethazine (PHENERGAN) injection 25 mg (25 mg Intravenous Given 11/29/13 1850)  iohexol (OMNIPAQUE) 300 MG/ML solution 100 mL (100 mLs Intravenous Contrast Given 11/29/13 2210)    New Prescriptions   PROMETHAZINE (PHENERGAN) 25 MG TABLET    Take 1 tablet (25 mg total) by mouth every 6 (six) hours as needed for nausea or vomiting.      Johnna Acosta, MD 11/29/13 2312

## 2013-11-29 NOTE — ED Notes (Signed)
Pt back from CT

## 2013-11-29 NOTE — Discharge Instructions (Signed)
Please call your doctor for a followup appointment within 24-48 hours. When you talk to your doctor please let them know that you were seen in the emergency department and have them acquire all of your records so that they can discuss the findings with you and formulate a treatment plan to fully care for your new and ongoing problems.  Phenergan for nausea   Emergency Department Resource Guide 1) Find a Doctor and Pay Out of Pocket Although you won't have to find out who is covered by your insurance plan, it is a good idea to ask around and get recommendations. You will then need to call the office and see if the doctor you have chosen will accept you as a new patient and what types of options they offer for patients who are self-pay. Some doctors offer discounts or will set up payment plans for their patients who do not have insurance, but you will need to ask so you aren't surprised when you get to your appointment.  2) Contact Your Local Health Department Not all health departments have doctors that can see patients for sick visits, but many do, so it is worth a call to see if yours does. If you don't know where your local health department is, you can check in your phone book. The CDC also has a tool to help you locate your state's health department, and many state websites also have listings of all of their local health departments.  3) Find a [facility] If your illness is not likely to be very severe or complicated, you may want to try a walk in clinic. These are popping up all over the country in pharmacies, drugstores, and shopping centers. They're usually staffed by nurse practitioners or physician assistants that have been trained to treat common illnesses and complaints. They're usually fairly quick and inexpensive. However, if you have serious medical issues or chronic medical problems, these are probably not your best option.  No Primary Care Doctor: - Call Health Connect at   [phone] - they can help you locate a primary care doctor that  accepts your insurance, provides certain services, etc. - Physician Referral Service- [phone]  Chronic Pain Problems: Organization         Address  Phone   Notes  [facility]  [phone] Patients need to be referred by their primary care doctor.   Medication Assistance: Organization         Address  Phone   Notes  [facility] Medication [facility] [place] [address] [place], [place] [address] [phone] --Must be a resident of [facility] -- Must have NO insurance coverage whatsoever (no Medicaid/ Medicare, etc.) -- The pt. MUST have a primary care doctor that directs their care regularly and follows them in the community   MedAssist  [phone]   [employer]  [phone]    Agencies that provide inexpensive medical care: Organization         Address  Phone   Notes  [place]  [phone]   [facility]    [phone]   [facility] [place], [place] [address] [phone]   [place] [address] [place] [phone]   Planned Parenthood    [phone]   [facility]    [phone]   [place] and [place],  [place] Phone:  [phone], Fax:  [phone] Hours of Operation:  9 am - 6 pm, M-F.  Also accepts Medicaid/Medicare and self-pay.  [facility] for [place], [address], [place] Phone: [phone], Fax: [phone]. Hours of Operation:  8:30 am - 5:30 pm, M-F.  Also accepts Medicaid and self-pay.  [facility] [place] [address], [place] Phone: [phone]   [place], [place], [place] [phone], Ext. [phone] Mondays & Thursdays: 7-9 AM.  First 15 patients are seen on a first come, first serve basis.     [place] Providers:  Organization         Address  Phone   Notes  [facility] [address], [place] [phone] Also accepts self-pay patients.  [facility] [address], [place]  [phone]   [place], [address], [place] [phone]   [facility] [address], [place] [phone]   Lucianne Lei [address], [place]   [phone] Only accepts [place] Access [place] patients after they have their name applied to their card.   Self-Pay (no insurance) in [facility]:  Organization         Address  Phone   Notes  Sickle Cell Patients, [facility] [place] [phone]   [facility] [place] [phone]   [facility] [place]  [place], [place], [place] [phone]   [facility]/Dr. Osei-Bonsu  [address], [place] or [address], [place] [phone] Phone number for both [place] and [place] locations is the same.  [facility] [address] [place] [phone]   [facility] [address], [place] or [address] [phone]   [facility] [address], [place] [phone], phone; [phone], fax Sees patients 1st and 3rd Saturday of every month.  Must not qualify for public or private insurance (i.e. Medicaid, Medicare, [place] Health Choice, Veterans' Benefits)  Household income should be no more than 200% of the poverty level The clinic cannot treat you if you are pregnant or think you are pregnant  Sexually transmitted diseases are not treated at the clinic.    Dental Care: Organization         Address  Phone  Notes  [facility]  Department of [facility] [place] [phone] Accepts children up to age 40 who are enrolled in [place] or [place]; pregnant women with a Medicaid card; and children who have applied for Medicaid or [place] Health Choice, but were declined, whose parents can pay a reduced fee at time of service.  [facility] Department of [facility]  [address], [place] [phone] Accepts children up to age 58 who are enrolled in [place] or [place]; pregnant women with a Medicaid card; and children who have applied for Medicaid or [place] Health Choice, but were declined, whose parents can pay a reduced fee at time of service.  [place] Adult Dental Access PROGRAM  [place] [phone] Patients are seen by  appointment only. Walk-ins are not accepted. [place] will see patients 41 years of age and older. Monday - Tuesday (8am-5pm) Most Wednesdays (8:30-5pm) $30 per visit, cash only  [facility] Adult Dental Access PROGRAM  [address], [facility] [phone] Patients are seen by appointment only. Walk-ins are not accepted. [place] will see patients 34 years of age and older. One Wednesday Evening (Monthly: Volunteer Based).  $30 per visit, cash only  [place]  [phone] for adults; Children under age 21, call Graduate Pediatric Dentistry at [phone]. Children aged 56-14, please call [phone] to request a pediatric application.  Dental services are provided in all areas of dental care including fillings, crowns and bridges, complete and partial dentures, implants, gum treatment, root canals, and extractions. Preventive care is also provided. Treatment is provided to both adults and children. Patients are selected via a lottery and there is often a waiting list.   [facility] [address] [place]  [phone]  [email or web address]   [facility] [address] [place], [place] [phone], Ext. [phone] Second and Fourth Thursday of each month, opens at 6:30 AM; Clinic ends at 9 AM.  Patients are seen on a first-come first-served basis, and a limited number are seen during each clinic.   [facility]  [address] [place], [place] [phone]   Eligibility Requirements You must have lived in [place], [place], or [place] counties for at least the last three months.   You cannot be eligible for state or federal sponsored [employer], including [employer], [place], or [employer].   You generally cannot be eligible for healthcare insurance through your employer.    How to apply: Eligibility screenings are held every Tuesday and Wednesday afternoon from 1:00 pm until 4:00 pm. You do not need an appointment for the interview!  [facility] [address] [place], [place]   [place]  [place] Department  [place]  [phone]    Behavioral Health Resources in the Community: Intensive Outpatient Programs Organization         Address  Phone  Notes  [place] [facility] [address] [place], [place] [phone]   [facility] [address] [place], [place]   ADS: Alcohol & Drug Svcs [address], [place], [place]   [place] [address],  [place], [place] or [phone]   Substance Abuse Resources Organization         Address  Phone  Notes  Alcohol and Drug Services  [phone]   [place]  [phone]   [facility] [place]   Chinita Pester  [phone]   Residential & Outpatient Substance Abuse Program  [phone]   Psychological Services Organization          Address  Phone  Notes  [facility]  [place]  [phone]   [place] [address] [place] [phone] or [phone]    Mobile Crisis Teams Organization         Address  Phone  Notes  [facility], Mobile Crisis Care Unit  [phone]   [facility]  [address] [place], [place]   [facility] [address], [place] [phone]  Self-Help/Support Groups Organization         Address  Phone             Notes  Mental Health Assoc. of [place] - variety of support groups  [place] Call for more information  Narcotics Anonymous (NA), [facility] [address], [employer] [place]  2 meetings at this location   Special educational needs teacher         Address  Phone  Notes  [facility] Residential Treatment [place],    [place]  [phone]   [facility]  [address] [place] [phone], [place], [place]   [place], [place] [phone] Admissions: 8am-3pm M-F  Incentives Substance [place] [address]    [place], [place] [phone]   [facility] [address] [place], [place], [place]   [facility] [address]  [place], [place]   [facility] Programs - Intensive Outpatient [address], [place], [place]   [facility] [address]  [place], [place] [phone] or [phone]   [facility] Services (RTS) [address] [place], [place] Accepts Medicaid  [facility] [place] [address]  [place] [phone] Substance Abuse/Addiction Treatment   [facility] Organization         Address  Phone  Notes  [facility]  [phone]   Domenic Schwab, PhD [address] Arlis [place], [place]   [phone] or [phone]   [place], [place] [phone]   [facility] [address] [place], [place] [phone] Insurance/Medicaid/sponsorship through [facility] and Families [address], [address] [place]                                    [place], [place] [phone] [place] [address], [place] [phone]    Dr. Adele Schilder  [phone]   [facility] Dept. 1) [address]  2) [place] 3)  [address], [place] [phone]  [phone]   [place] [phone] or [phone] (After Hours)

## 2013-11-29 NOTE — ED Notes (Signed)
Per pt family sts she has been vomiting since Tuesday. sts also cough. denies diarrhea. sts having some lower abdominal pain.

## 2013-11-29 NOTE — ED Notes (Signed)
Called CT - pt next to be taken back

## 2013-11-29 NOTE — ED Notes (Signed)
Called CT - pt finished contrast 

## 2013-11-29 NOTE — ED Notes (Signed)
Pt taken to CT.

## 2013-12-02 ENCOUNTER — Encounter: Payer: Self-pay | Admitting: Medical

## 2013-12-02 ENCOUNTER — Ambulatory Visit (INDEPENDENT_AMBULATORY_CARE_PROVIDER_SITE_OTHER): Payer: No Typology Code available for payment source | Admitting: Medical

## 2013-12-02 VITALS — BP 98/70 | HR 105 | Temp 98.3°F | Resp 14 | Wt 114.0 lb

## 2013-12-02 DIAGNOSIS — R42 Dizziness and giddiness: Secondary | ICD-10-CM

## 2013-12-02 DIAGNOSIS — R059 Cough, unspecified: Secondary | ICD-10-CM

## 2013-12-02 DIAGNOSIS — R05 Cough: Secondary | ICD-10-CM

## 2013-12-02 DIAGNOSIS — R5381 Other malaise: Secondary | ICD-10-CM

## 2013-12-02 DIAGNOSIS — R5383 Other fatigue: Secondary | ICD-10-CM

## 2013-12-02 LAB — POCT URINALYSIS DIPSTICK
Bilirubin, UA: NEGATIVE
Glucose, UA: 50
Ketones, UA: NEGATIVE
LEUKOCYTES UA: NEGATIVE
Nitrite, UA: NEGATIVE
PH UA: 7
Protein, UA: NEGATIVE
Spec Grav, UA: <=1.005
UROBILINOGEN UA: NEGATIVE

## 2013-12-02 MED ORDER — HYDROCODONE-HOMATROPINE 5-1.5 MG/5ML PO SYRP: 5.0000 mL | ORAL_SOLUTION | Freq: Three times a day (TID) | ORAL | Status: DC | PRN

## 2013-12-02 MED ORDER — MECLIZINE HCL 25 MG PO TABS
25.0000 mg | ORAL_TABLET | Freq: Two times a day (BID) | ORAL | Status: DC
Start: 2013-12-02 — End: 2013-12-23

## 2013-12-02 NOTE — Progress Notes (Signed)
Subjective: Here with son who translates.  Here for not feeling well and emergency department followup.  Over the weekend was not acting like herself, was weak and lethargic, so they took her to the emergency department.  Had IV fluids labs, and then they discharged her home.  Her cough seems somewhat improved but not completely back to normal.  Son says that she still is lethargic but acts more like herself today.  Today she has complained of feeling dizzy, still coughing, feels too dizzy to walk, some headache.  She has chronic pelvic pain ongoing.  Denies fever, diarrhea, confusion, no productive cough.  Her son says she is drinking quite a bit of water  Review of systems as in subjective   Objective: Filed Vitals:   12/02/13 1354  BP: 100/80  Pulse: 84  Temp: 98.3 F (36.8 C)  Resp: 14    General appearance: alert, lying on exam table, coughing some HEENT: normocephalic, sclerae anicteric, TMs pearly, nares patent, no discharge or erythema, pharynx normal Oral cavity: somewhat dry, no lesions Neck: supple, no lymphadenopathy, no thyromegaly, no masses Heart: RRR, normal S1, S2, no murmurs Lungs: CTA bilaterally, no wheezes, rhonchi, or rales Abdomen: +bs, soft, generalized lower abdominal tenderness, non distended, no masses, no hepatomegaly, no splenomegaly Pulses: 2+ symmetric, upper and lower extremities, normal cap refill Neuro: A&O x3, nonfocal    Assessment:  Encounter Diagnoses  Name Primary?  . Dizziness Yes  . Cough   . Other malaise and fatigue      Plan: I reviewed her emergency department records, labs, CT abdomen pelvis, chest x-ray. She still seems weak today.  Note given for work, meclizine given for dizziness, discussed hydration, finish antibiotic clarithromycin, Hycodan syrup as needed for cough Phenergan as needed for nausea.  Reviewed her orthostatics and urinalysis from today.  At this point no specific indication for repeat labs or imaging.  I  asked him to call back Wednesday let me know how she is doing, call back or return sooner if needed or not improving

## 2013-12-11 ENCOUNTER — Encounter: Payer: Self-pay | Admitting: General Practice

## 2013-12-14 ENCOUNTER — Observation Stay (HOSPITAL_COMMUNITY)
Admission: EM | Admit: 2013-12-14 | Discharge: 2013-12-16 | Disposition: A | Discharge status: Home / Self Care | Payer: No Typology Code available for payment source | Attending: Surgery | Admitting: Surgery

## 2013-12-14 ENCOUNTER — Encounter (HOSPITAL_COMMUNITY): Payer: Self-pay | Admitting: Emergency Medicine

## 2013-12-14 ENCOUNTER — Emergency Department (HOSPITAL_COMMUNITY): Payer: No Typology Code available for payment source

## 2013-12-14 DIAGNOSIS — R112 Nausea with vomiting, unspecified: Secondary | ICD-10-CM

## 2013-12-14 DIAGNOSIS — N949 Unspecified condition associated with female genital organs and menstrual cycle: Secondary | ICD-10-CM | POA: Insufficient documentation

## 2013-12-14 DIAGNOSIS — R1011 Right upper quadrant pain: Secondary | ICD-10-CM

## 2013-12-14 DIAGNOSIS — N938 Other specified abnormal uterine and vaginal bleeding: Secondary | ICD-10-CM | POA: Insufficient documentation

## 2013-12-14 DIAGNOSIS — K810 Acute cholecystitis: Secondary | ICD-10-CM | POA: Diagnosis present

## 2013-12-14 DIAGNOSIS — K8000 Calculus of gallbladder with acute cholecystitis without obstruction: Principal | ICD-10-CM | POA: Insufficient documentation

## 2013-12-14 DIAGNOSIS — Z87891 Personal history of nicotine dependence: Secondary | ICD-10-CM | POA: Insufficient documentation

## 2013-12-14 LAB — COMPREHENSIVE METABOLIC PANEL
ALT: 22 U/L (ref 0–35)
AST: 19 U/L (ref 0–37)
Albumin: 4 g/dL (ref 3.5–5.2)
Alkaline Phosphatase: 79 U/L (ref 39–117)
BUN: 8 mg/dL (ref 6–23)
CALCIUM: 10.6 mg/dL — AB (ref 8.4–10.5)
CO2: 26 meq/L (ref 19–32)
Chloride: 100 mEq/L (ref 96–112)
Creatinine, Ser: 0.71 mg/dL (ref 0.50–1.10)
GFR calc Af Amer: >90 mL/min (ref >90)
Glucose, Bld: 95 mg/dL (ref 70–99)
Potassium: 4.3 mEq/L (ref 3.7–5.3)
Sodium: 138 mEq/L (ref 137–147)
Total Bilirubin: 0.9 mg/dL (ref 0.3–1.2)
Total Protein: 7.5 g/dL (ref 6.0–8.3)

## 2013-12-14 LAB — URINALYSIS, ROUTINE W REFLEX MICROSCOPIC
Bilirubin Urine: NEGATIVE
Glucose, UA: NEGATIVE mg/dL
HGB URINE DIPSTICK: NEGATIVE
Ketones, ur: NEGATIVE mg/dL
Leukocytes, UA: NEGATIVE
Nitrite: NEGATIVE
PROTEIN: NEGATIVE mg/dL
Specific Gravity, Urine: 1.008 (ref 1.005–1.030)
UROBILINOGEN UA: 0.2 mg/dL (ref 0.0–1.0)
pH: 7 (ref 5.0–8.0)

## 2013-12-14 LAB — LIPASE, BLOOD: Lipase: 22 U/L (ref 11–59)

## 2013-12-14 LAB — CBC WITH DIFFERENTIAL/PLATELET
Basophils Absolute: 0 10*3/uL (ref 0.0–0.1)
Basophils Relative: 0 % (ref 0–1)
EOS ABS: 0.2 10*3/uL (ref 0.0–0.7)
EOS PCT: 2 % (ref 0–5)
HEMATOCRIT: 36.6 % (ref 36.0–46.0)
Hemoglobin: 12.2 g/dL (ref 12.0–15.0)
LYMPHS ABS: 2 10*3/uL (ref 0.7–4.0)
Lymphocytes Relative: 24 % (ref 12–46)
MCH: 29.2 pg (ref 26.0–34.0)
MCHC: 33.3 g/dL (ref 30.0–36.0)
MCV: 87.6 fL (ref 78.0–100.0)
MONO ABS: 0.4 10*3/uL (ref 0.1–1.0)
Monocytes Relative: 5 % (ref 3–12)
Neutro Abs: 5.7 10*3/uL (ref 1.7–7.7)
Neutrophils Relative %: 69 % (ref 43–77)
Platelets: 200 10*3/uL (ref 150–400)
RBC: 4.18 MIL/uL (ref 3.87–5.11)
RDW: 12.5 % (ref 11.5–15.5)
WBC: 8.3 10*3/uL (ref 4.0–10.5)

## 2013-12-14 LAB — POC URINE PREG, ED: Preg Test, Ur: NEGATIVE

## 2013-12-14 MED ORDER — PROMETHAZINE HCL 25 MG PO TABS
25.0000 mg | ORAL_TABLET | Freq: Once | ORAL | Status: AC
  Administered 2013-12-14: 25 mg via ORAL
  Filled 2013-12-14: qty 1

## 2013-12-14 MED ORDER — PROMETHAZINE HCL 25 MG/ML IJ SOLN
12.5000 mg | Freq: Four times a day (QID) | INTRAMUSCULAR | Status: DC | PRN
  Filled 2013-12-14: qty 1

## 2013-12-14 MED ORDER — PANTOPRAZOLE SODIUM 40 MG PO TBEC
40.0000 mg | DELAYED_RELEASE_TABLET | Freq: Once | ORAL | Status: AC
  Administered 2013-12-14: 40 mg via ORAL
  Filled 2013-12-14: qty 1

## 2013-12-14 MED ORDER — MORPHINE SULFATE 4 MG/ML IJ SOLN
4.0000 mg | Freq: Once | INTRAMUSCULAR | Status: AC
  Administered 2013-12-14: 4 mg via INTRAVENOUS
  Filled 2013-12-14: qty 1

## 2013-12-14 MED ORDER — HYDROMORPHONE HCL PF 1 MG/ML IJ SOLN
0.5000 mg | INTRAMUSCULAR | Status: DC | PRN
  Administered 2013-12-15: 0.5 mg via INTRAVENOUS
  Filled 2013-12-14: qty 1

## 2013-12-14 MED ORDER — HYDROCODONE-ACETAMINOPHEN 5-325 MG PO TABS
2.0000 | ORAL_TABLET | Freq: Once | ORAL | Status: AC
  Administered 2013-12-14: 2 via ORAL
  Filled 2013-12-14: qty 2

## 2013-12-14 MED ORDER — SODIUM CHLORIDE 0.9 % IV BOLUS (SEPSIS)
1000.0000 mL | Freq: Once | INTRAVENOUS | Status: AC
  Administered 2013-12-14: 1000 mL via INTRAVENOUS

## 2013-12-14 MED ORDER — MECLIZINE HCL 25 MG PO TABS
25.0000 mg | ORAL_TABLET | Freq: Two times a day (BID) | ORAL | Status: DC
  Administered 2013-12-15 – 2013-12-16 (×3): 25 mg via ORAL
  Filled 2013-12-14 (×5): qty 1

## 2013-12-14 MED ORDER — DEXTROSE-NACL 5-0.9 % IV SOLN
INTRAVENOUS | Status: DC
  Administered 2013-12-15: 17:00:00 via INTRAVENOUS
  Administered 2013-12-15: 1000 mL via INTRAVENOUS
  Administered 2013-12-15: 23:00:00 via INTRAVENOUS

## 2013-12-14 MED ORDER — SODIUM CHLORIDE 0.9 % IV SOLN
3.0000 g | Freq: Four times a day (QID) | INTRAVENOUS | Status: DC
  Administered 2013-12-15 (×3): 3 g via INTRAVENOUS
  Filled 2013-12-14 (×7): qty 3

## 2013-12-14 NOTE — ED Notes (Addendum)
Pt was seen here on 5/8 for same. Still has intermittent vomiting and abd pain, denies diarrhea. Reports hx of ulcer and feels like she has acid in her stomach.

## 2013-12-14 NOTE — ED Notes (Signed)
[id number] is extension of receiving nurse, Marvetta Gibbons.  She will call back in 5 min to receive report.

## 2013-12-14 NOTE — ED Notes (Signed)
Reported to Dr. Lahoma Rocker that the patient would like more pain medication.  She plans to see patient to update CT results with interpreter.

## 2013-12-14 NOTE — ED Notes (Signed)
Attempted to call report

## 2013-12-14 NOTE — ED Notes (Signed)
[place] is the language that the patient speaks.

## 2013-12-14 NOTE — ED Provider Notes (Signed)
CSN: [id number]     Arrival date & time 12/14/13  1528 History   First MD Initiated Contact with Patient 12/14/13 1540     Chief Complaint  Patient presents with  . Emesis     (Consider location/radiation/quality/duration/timing/severity/associated sxs/prior Treatment) Patient is a 48 y.o. female presenting with vomiting. The history is provided by the patient. No language interpreter was used.  Emesis Severity:  Moderate Duration:  2 weeks Timing:  Intermittent Number of daily episodes:  0-5 Quality:  Stomach contents How soon after eating does vomiting occur:  5 minutes Progression:  Unchanged Chronicity:  Recurrent (similar as a teenager) Recent urination:  Normal Relieved by:  Nothing Exacerbated by: eating & drinking. Associated symptoms: abdominal pain and diarrhea   Associated symptoms: no arthralgias, no chills, no cough, no fever, no headaches and no sore throat   Abdominal pain:    Location:  Epigastric   Quality:  Unable to specify   Severity:  Moderate   Onset quality:  Sudden   Duration:  2 weeks   Timing:  Intermittent   Progression:  Waxing and waning   Chronicity:  New Diarrhea:    Quality:  Semi-solid   Number of occurrences:  Several times a week   Severity:  Mild   Timing:  Sporadic   Progression:  Worsening Risk factors: prior abdominal surgery   Risk factors: no alcohol use, no diabetes, not pregnant now, no sick contacts, no suspect food intake and no travel to endemic areas     Past Medical History  Diagnosis Date  . Wears glasses   . Pelvic pain   . DUB (dysfunctional uterine bleeding)    Past Surgical History  Procedure Laterality Date  . Abdominal surgery    . Dilation and curettage of uterus    . Endometrial ablation  2010    same surgery 3 times   . Tubal ligation     History reviewed. No pertinent family history. History  Substance Use Topics  . Smoking status: Former Research scientist (life sciences)  . Smokeless tobacco: Current User  . Alcohol Use:  No   OB History   Grav Para Term Preterm Abortions TAB SAB Ect Mult Living   5 5 5       3      Review of Systems  Constitutional: Negative for fever, chills, diaphoresis, activity change, appetite change and fatigue.  HENT: Negative for congestion, facial swelling, rhinorrhea and sore throat.   Eyes: Negative for photophobia and discharge.  Respiratory: Negative for cough, chest tightness and shortness of breath.   Cardiovascular: Negative for chest pain, palpitations and leg swelling.  Gastrointestinal: Positive for vomiting, abdominal pain and diarrhea. Negative for nausea.  Endocrine: Negative for polydipsia and polyuria.  Genitourinary: Negative for dysuria, frequency, difficulty urinating and pelvic pain.  Musculoskeletal: Negative for arthralgias, back pain, neck pain and neck stiffness.  Skin: Negative for color change and wound.  Allergic/Immunologic: Negative for immunocompromised state.  Neurological: Negative for facial asymmetry, weakness, numbness and headaches.  Hematological: Does not bruise/bleed easily.  Psychiatric/Behavioral: Negative for confusion and agitation.      Allergies  Zofran  Home Medications   Prior to Admission medications   Medication Sig Start Date End Date Taking? Authorizing Provider  clarithromycin (BIAXIN) 500 MG tablet Take 1 tablet (500 mg total) by mouth 2 (two) times daily. 11/27/13   Camelia Eng Tysinger, PA-C  HYDROcodone-homatropine (HYCODAN) 5-1.5 MG/5ML syrup Take 5 mLs by mouth every 8 (eight) hours as needed for cough.  12/02/13   Camelia Eng Tysinger, PA-C  ibuprofen (ADVIL,MOTRIN) 600 MG tablet Take 1 tablet (600 mg total) by mouth every 8 (eight) hours as needed (pain). 10/24/13   Camelia Eng Tysinger, PA-C  meclizine (ANTIVERT) 25 MG tablet Take 1 tablet (25 mg total) by mouth 2 (two) times daily. 12/02/13   Camelia Eng Tysinger, PA-C  promethazine (PHENERGAN) 25 MG tablet Take 1 tablet (25 mg total) by mouth every 6 (six) hours as needed for nausea  or vomiting. 11/29/13   Johnna Acosta, MD   BP 100/62  Pulse 49  Temp(Src) 97.9 F (36.6 C) (Oral)  Resp 16  Ht 4\' 10"  (1.473 m)  Wt 120 lb 4.8 oz (54.568 kg)  BMI 25.15 kg/m2  SpO2 97% Physical Exam  Constitutional: She is oriented to person, place, and time. She appears well-developed and well-nourished. No distress.  HENT:  Head: Normocephalic and atraumatic.  Mouth/Throat: No oropharyngeal exudate.  Eyes: Pupils are equal, round, and reactive to light.  Neck: Normal range of motion. Neck supple.  Cardiovascular: Normal rate, regular rhythm and normal heart sounds.  Exam reveals no gallop and no friction rub.   No murmur heard. Pulmonary/Chest: Effort normal and breath sounds normal. No respiratory distress. She has no wheezes. She has no rales.  Abdominal: Soft. Bowel sounds are normal. She exhibits no distension and no mass. There is tenderness in the epigastric area. There is no rigidity, no rebound and no guarding.  Musculoskeletal: Normal range of motion. She exhibits no edema and no tenderness.  Neurological: She is alert and oriented to person, place, and time.  Skin: Skin is warm and dry.  Psychiatric: She has a normal mood and affect.    ED Course  Procedures (including critical care time) Labs Review Labs Reviewed  COMPREHENSIVE METABOLIC PANEL - Abnormal; Notable for the following:    Calcium 10.6 (*)    All other components within normal limits  URINE CULTURE  CBC WITH DIFFERENTIAL  LIPASE, BLOOD  URINALYSIS, ROUTINE W REFLEX MICROSCOPIC  POC URINE PREG, ED    Imaging Review US Abdomen Limited  12/14/2013   CLINICAL DATA:  Post prandial epigastric pain and emesis  EXAM: US ABDOMEN LIMITED - RIGHT UPPER QUADRANT  COMPARISON:  CT, 11/29/2013  FINDINGS: Gallbladder:  There are gallstones, 1 in the gallbladder neck. The wall is prominent measuring 3 mm. There is wall edema.  Common bile duct:  Diameter: 5 mm.  No duct stone is seen.  There is distal tapering.   Liver:  No focal lesion identified. Within normal limits in parenchymal echogenicity.  IMPRESSION: 1. Gallstones, 1 which appears impacted in the gallbladder neck, and mild gallbladder wall thickening with gallbladder wall edema. Findings support acute cholecystitis in the proper clinical setting.   Electronically Signed   By: Lajean Manes M.D.   On: 12/14/2013 18:25     EKG Interpretation None      MDM   Final diagnoses:  Acute cholecystitis    Pt is a 48 y.o. female with Pmhx as above including DUB and chronic pelvic pain who presents with about 2 wks of epigastric pain and emesis after PO. Pt seen 5/8 for cough & lower abdominal pain, had nml CXR, nml CT ab/pelvis, and unremarkable CBC, CMP, UA. She has since had f/u with PCP office. She has seen GYN in the past for DUB with hysterectomy sched for 1.5 weeks, though pt says this pain is different from prior visit and reminds her of pain  she had w/ a gastric ulcer as a teenager. No fever, occasional non-bloody d/a, no urinary symptoms, no longer having vaginal bleeding. On PE, VSS, pt in NAD. +epigastric ttp w/o rebound or guarding. Cardiopulm exam benign. [place] ab ordered to r/o cholecystitis and showed gallstones, likely stone impacted in neck, mild GB wall thickening and edema.  CBC, CMP, lipase, UA grossly unremarkable including nml WBC.  Pt initially felt improved, but took a sip of water and pain reoccurred. Gen Surg consulted, will admit, will likely have lap chole tomorrow.       Neta Ehlers, MD 12/15/13 1053

## 2013-12-14 NOTE — H&P (Signed)
Katherine [name] is an 48 y.o. female.   Chief Complaint: Abdominal pain HPI: Patient is a 49 year old female, Nepalese with her son who translates, who presents with epigastric/right upper quadrant abdominal pain. She states this is gone for approximately 2 weeks. He states that today after having some water patient began having nausea and vomiting. Patient also describes pain in the epigastrium right upper quadrant. Patient underwent ultrasound which reveals large gallstone impacted at the neck of the gallbladder as well as pericholecystic fluid and gallbladder wall thickening.  Of note patient has had previous uterine surgeries and is slated for uterine surgery next week [facility].  Past Medical History  Diagnosis Date  . Wears glasses   . Pelvic pain   . DUB (dysfunctional uterine bleeding)     Past Surgical History  Procedure Laterality Date  . Abdominal surgery    . Dilation and curettage of uterus    . Endometrial ablation  2010    same surgery 3 times   . Tubal ligation      History reviewed. No pertinent family history. Social History:  reports that she has quit smoking. She uses smokeless tobacco. She reports that she does not drink alcohol or use illicit drugs.  Allergies:  Allergies  Allergen Reactions  . Zofran [Ondansetron Hcl] Hives     (Not in a hospital admission)  Results for orders placed during the hospital encounter of 12/14/13 (from the past 48 hour(s))  CBC WITH DIFFERENTIAL     Status: None   Collection Time    12/14/13  4:00 PM      Result Value Ref Range   WBC 8.3  4.0 - 10.5 K/uL   RBC 4.18  3.87 - 5.11 MIL/uL   Hemoglobin 12.2  12.0 - 15.0 g/dL   HCT 36.6  36.0 - 46.0 %   MCV 87.6  78.0 - 100.0 fL   MCH 29.2  26.0 - 34.0 pg   MCHC 33.3  30.0 - 36.0 g/dL   RDW 12.5  11.5 - 15.5 %   Platelets 200  150 - 400 K/uL   Neutrophils Relative % 69  43 - 77 %   Neutro Abs 5.7  1.7 - 7.7 K/uL   Lymphocytes Relative 24  12 - 46 %   Lymphs Abs 2.0   0.7 - 4.0 K/uL   Monocytes Relative 5  3 - 12 %   Monocytes Absolute 0.4  0.1 - 1.0 K/uL   Eosinophils Relative 2  0 - 5 %   Eosinophils Absolute 0.2  0.0 - 0.7 K/uL   Basophils Relative 0  0 - 1 %   Basophils Absolute 0.0  0.0 - 0.1 K/uL  COMPREHENSIVE METABOLIC PANEL     Status: Abnormal   Collection Time    12/14/13  4:00 PM      Result Value Ref Range   Sodium 138  137 - 147 mEq/L   Potassium 4.3  3.7 - 5.3 mEq/L   Chloride 100  96 - 112 mEq/L   CO2 26  19 - 32 mEq/L   Glucose, Bld 95  70 - 99 mg/dL   BUN 8  6 - 23 mg/dL   Creatinine, Ser 0.71  0.50 - 1.10 mg/dL   Calcium 10.6 (*) 8.4 - 10.5 mg/dL   Total Protein 7.5  6.0 - 8.3 g/dL   Albumin 4.0  3.5 - 5.2 g/dL   AST 19  0 - 37 U/L   ALT 22  0 - 35 U/L   Alkaline Phosphatase 79  39 - 117 U/L   Total Bilirubin 0.9  0.3 - 1.2 mg/dL   GFR calc non Af Amer >90  >90 mL/min   GFR calc Af Amer >90  >90 mL/min   Comment: (NOTE)     The eGFR has been calculated using the CKD EPI equation.     This calculation has not been validated in all clinical situations.     eGFR's persistently <90 mL/min signify possible Chronic Kidney     Disease.  LIPASE, BLOOD     Status: None   Collection Time    12/14/13  4:00 PM      Result Value Ref Range   Lipase 22  11 - 59 U/L  URINALYSIS, ROUTINE W REFLEX MICROSCOPIC     Status: None   Collection Time    12/14/13  4:52 PM      Result Value Ref Range   Color, Urine YELLOW  YELLOW   APPearance CLEAR  CLEAR   Specific Gravity, Urine 1.008  1.005 - 1.030   pH 7.0  5.0 - 8.0   Glucose, UA NEGATIVE  NEGATIVE mg/dL   Hgb urine dipstick NEGATIVE  NEGATIVE   Bilirubin Urine NEGATIVE  NEGATIVE   Ketones, ur NEGATIVE  NEGATIVE mg/dL   Protein, ur NEGATIVE  NEGATIVE mg/dL   Urobilinogen, UA 0.2  0.0 - 1.0 mg/dL   Nitrite NEGATIVE  NEGATIVE   Leukocytes, UA NEGATIVE  NEGATIVE   Comment: MICROSCOPIC NOT DONE ON URINES WITH NEGATIVE PROTEIN, BLOOD, LEUKOCYTES, NITRITE, OR GLUCOSE <1000 mg/dL.  POC  URINE PREG, ED     Status: None   Collection Time    12/14/13  4:58 PM      Result Value Ref Range   Preg Test, Ur NEGATIVE  NEGATIVE   Comment:            THE SENSITIVITY OF THIS     METHODOLOGY IS >24 mIU/mL   US Abdomen Limited  12/14/2013   CLINICAL DATA:  Post prandial epigastric pain and emesis  EXAM: US ABDOMEN LIMITED - RIGHT UPPER QUADRANT  COMPARISON:  CT, 11/29/2013  FINDINGS: Gallbladder:  There are gallstones, 1 in the gallbladder neck. The wall is prominent measuring 3 mm. There is wall edema.  Common bile duct:  Diameter: 5 mm.  No duct stone is seen.  There is distal tapering.  Liver:  No focal lesion identified. Within normal limits in parenchymal echogenicity.  IMPRESSION: 1. Gallstones, 1 which appears impacted in the gallbladder neck, and mild gallbladder wall thickening with gallbladder wall edema. Findings support acute cholecystitis in the proper clinical setting.   Electronically Signed   By: Lajean Manes M.D.   On: 12/14/2013 18:25    Review of Systems  Constitutional: Negative for weight loss.  HENT: Negative for ear discharge, ear pain, hearing loss and tinnitus.   Eyes: Negative for blurred vision, double vision, photophobia and pain.  Respiratory: Negative for cough, sputum production and shortness of breath.   Cardiovascular: Negative for chest pain.  Gastrointestinal: Positive for nausea, vomiting and abdominal pain.  Genitourinary: Negative for dysuria, urgency, frequency and flank pain.  Musculoskeletal: Negative for back pain, falls, joint pain, myalgias and neck pain.  Neurological: Negative for dizziness, tingling, sensory change, focal weakness, loss of consciousness and headaches.  Endo/Heme/Allergies: Does not bruise/bleed easily.  Psychiatric/Behavioral: Negative for depression, memory loss and substance abuse. The patient is not nervous/anxious.  Blood pressure 114/60, pulse 63, temperature 97.9 F (36.6 C), temperature source Oral, resp. rate  16, height 4' 10"  (1.473 m), SpO2 97.00%. Physical Exam  Vitals reviewed. Constitutional: She is oriented to person, place, and time. She appears well-developed and well-nourished. She is cooperative. No distress. Cervical collar and nasal cannula in place.  HENT:  Head: Normocephalic and atraumatic. Head is without raccoon's eyes, without Battle's sign, without abrasion, without contusion and without laceration.  Right Ear: Hearing, tympanic membrane, external ear and ear canal normal. No lacerations. No drainage or tenderness. No foreign bodies. Tympanic membrane is not perforated. No hemotympanum.  Left Ear: Hearing, tympanic membrane, external ear and ear canal normal. No lacerations. No drainage or tenderness. No foreign bodies. Tympanic membrane is not perforated. No hemotympanum.  Nose: Nose normal. No nose lacerations, sinus tenderness, nasal deformity or nasal septal hematoma. No epistaxis.  Mouth/Throat: Uvula is midline, oropharynx is clear and moist and mucous membranes are normal. No lacerations.  Eyes: Conjunctivae, EOM and lids are normal. Pupils are equal, round, and reactive to light. No scleral icterus.  Neck: Trachea normal. No JVD present. No spinous process tenderness and no muscular tenderness present. Carotid bruit is not present. No thyromegaly present.  Cardiovascular: Normal rate, regular rhythm, normal heart sounds, intact distal pulses and normal pulses.   Respiratory: Effort normal and breath sounds normal. No respiratory distress. She exhibits no tenderness, no bony tenderness, no laceration and no crepitus.  GI: Soft. Normal appearance. She exhibits no distension. Bowel sounds are decreased. There is tenderness in the right upper quadrant and epigastric area. There is no rigidity, no rebound, no guarding and no CVA tenderness.  Musculoskeletal: Normal range of motion. She exhibits no edema and no tenderness.  Lymphadenopathy:    She has no cervical adenopathy.   Neurological: She is alert and oriented to person, place, and time. She has normal strength. No cranial nerve deficit or sensory deficit. GCS eye subscore is 4. GCS verbal subscore is 5. GCS motor subscore is 6.  Skin: Skin is warm, dry and intact. She is not diaphoretic.  Psychiatric: She has a normal mood and affect. Her speech is normal and behavior is normal.     Assessment/Plan 48 year old female with acute cholecystitis  1. We'll admit to the hospital 2. N.p.o. 3. IV antibiotics 4. We'll consent for laparoscopic cholecystectomy  Ralene Ok 12/14/2013, 8:48 PM

## 2013-12-14 NOTE — ED Notes (Signed)
Pt transported to ultrasound.

## 2013-12-14 NOTE — ED Notes (Signed)
Dr. Lahoma Rocker is at the bedside with the patient and interpreter.

## 2013-12-14 NOTE — ED Notes (Signed)
Pt states intermittent vomiting and abdominal pain for several weeks. Was seen for same. Also states acid reflux. 5/10 pain at the time. Denies diarrhea. Denies urinary symptoms. States she is concerned she could have stomach ulcer. Pt does not speak english, family at bedside to translate.

## 2013-12-15 ENCOUNTER — Encounter (HOSPITAL_COMMUNITY): Payer: No Typology Code available for payment source | Admitting: Anesthesiology

## 2013-12-15 ENCOUNTER — Encounter (HOSPITAL_COMMUNITY): Payer: Self-pay | Admitting: Anesthesiology

## 2013-12-15 ENCOUNTER — Encounter (HOSPITAL_COMMUNITY)
Admission: EM | Disposition: A | Discharge status: Home / Self Care | Payer: Self-pay | Source: Home / Self Care | Attending: Emergency Medicine

## 2013-12-15 ENCOUNTER — Observation Stay (HOSPITAL_COMMUNITY): Payer: No Typology Code available for payment source | Admitting: Anesthesiology

## 2013-12-15 DIAGNOSIS — K824 Cholesterolosis of gallbladder: Secondary | ICD-10-CM

## 2013-12-15 DIAGNOSIS — K8010 Calculus of gallbladder with chronic cholecystitis without obstruction: Secondary | ICD-10-CM

## 2013-12-15 HISTORY — PX: CHOLECYSTECTOMY: SHX55

## 2013-12-15 LAB — SURGICAL PCR SCREEN
MRSA, PCR: NEGATIVE
STAPHYLOCOCCUS AUREUS: NEGATIVE

## 2013-12-15 SURGERY — LAPAROSCOPIC CHOLECYSTECTOMY WITH INTRAOPERATIVE CHOLANGIOGRAM
Anesthesia: General

## 2013-12-15 MED ORDER — ROCURONIUM BROMIDE 100 MG/10ML IV SOLN
INTRAVENOUS | Status: DC | PRN
  Administered 2013-12-15: 15 mg via INTRAVENOUS

## 2013-12-15 MED ORDER — HYDROMORPHONE HCL PF 1 MG/ML IJ SOLN
0.2500 mg | INTRAMUSCULAR | Status: DC | PRN
  Administered 2013-12-15 (×3): 0.5 mg via INTRAVENOUS
  Administered 2013-12-15 (×2): 0.25 mg via INTRAVENOUS

## 2013-12-15 MED ORDER — SODIUM CHLORIDE 0.9 % IR SOLN
Status: DC | PRN
  Administered 2013-12-15: 1000 mL

## 2013-12-15 MED ORDER — LIDOCAINE HCL (CARDIAC) 20 MG/ML IV SOLN
INTRAVENOUS | Status: DC | PRN
  Administered 2013-12-15: 60 mg via INTRAVENOUS

## 2013-12-15 MED ORDER — LIDOCAINE HCL (CARDIAC) 20 MG/ML IV SOLN
INTRAVENOUS | Status: AC
  Filled 2013-12-15: qty 10

## 2013-12-15 MED ORDER — NEOSTIGMINE METHYLSULFATE 10 MG/10ML IV SOLN
INTRAVENOUS | Status: AC
  Filled 2013-12-15: qty 1

## 2013-12-15 MED ORDER — ROCURONIUM BROMIDE 50 MG/5ML IV SOLN
INTRAVENOUS | Status: AC
  Filled 2013-12-15: qty 1

## 2013-12-15 MED ORDER — HYDROMORPHONE HCL PF 1 MG/ML IJ SOLN
0.5000 mg | INTRAMUSCULAR | Status: DC | PRN
  Administered 2013-12-15 – 2013-12-16 (×3): 0.5 mg via INTRAVENOUS
  Filled 2013-12-15 (×3): qty 1

## 2013-12-15 MED ORDER — PROPOFOL 10 MG/ML IV BOLUS
INTRAVENOUS | Status: AC
  Filled 2013-12-15: qty 20

## 2013-12-15 MED ORDER — SUCCINYLCHOLINE CHLORIDE 20 MG/ML IJ SOLN
INTRAMUSCULAR | Status: AC
  Filled 2013-12-15: qty 1

## 2013-12-15 MED ORDER — DEXAMETHASONE SODIUM PHOSPHATE 4 MG/ML IJ SOLN
INTRAMUSCULAR | Status: AC
  Filled 2013-12-15: qty 2

## 2013-12-15 MED ORDER — GLYCOPYRROLATE 0.2 MG/ML IJ SOLN
INTRAMUSCULAR | Status: DC | PRN
  Administered 2013-12-15: 0.4 mg via INTRAVENOUS

## 2013-12-15 MED ORDER — GLYCOPYRROLATE 0.2 MG/ML IJ SOLN
INTRAMUSCULAR | Status: AC
  Filled 2013-12-15: qty 2

## 2013-12-15 MED ORDER — LACTATED RINGERS IV SOLN
INTRAVENOUS | Status: DC | PRN
  Administered 2013-12-15 (×2): via INTRAVENOUS

## 2013-12-15 MED ORDER — ACETAMINOPHEN 10 MG/ML IV SOLN
1000.0000 mg | Freq: Once | INTRAVENOUS | Status: AC
  Administered 2013-12-15: 1000 mg via INTRAVENOUS

## 2013-12-15 MED ORDER — SUCCINYLCHOLINE CHLORIDE 20 MG/ML IJ SOLN
INTRAMUSCULAR | Status: DC | PRN
Start: 2013-12-15 — End: 2013-12-15
  Administered 2013-12-15: 60 mg via INTRAVENOUS

## 2013-12-15 MED ORDER — BUPIVACAINE-EPINEPHRINE 0.25% -1:200000 IJ SOLN
INTRAMUSCULAR | Status: DC | PRN
  Administered 2013-12-15: 20 mL

## 2013-12-15 MED ORDER — FENTANYL CITRATE 0.05 MG/ML IJ SOLN
INTRAMUSCULAR | Status: AC
  Filled 2013-12-15: qty 5

## 2013-12-15 MED ORDER — NEOSTIGMINE METHYLSULFATE 10 MG/10ML IV SOLN
INTRAVENOUS | Status: DC | PRN
  Administered 2013-12-15: 3 mg via INTRAVENOUS

## 2013-12-15 MED ORDER — FENTANYL CITRATE 0.05 MG/ML IJ SOLN
INTRAMUSCULAR | Status: DC | PRN
  Administered 2013-12-15: 100 ug via INTRAVENOUS
  Administered 2013-12-15: 50 ug via INTRAVENOUS

## 2013-12-15 MED ORDER — ONDANSETRON HCL 4 MG/2ML IJ SOLN: 4.0000 mg | Freq: Once | INTRAMUSCULAR | Status: DC | PRN

## 2013-12-15 MED ORDER — BUPIVACAINE-EPINEPHRINE (PF) 0.25% -1:200000 IJ SOLN
INTRAMUSCULAR | Status: AC
  Filled 2013-12-15: qty 30

## 2013-12-15 MED ORDER — HYDROMORPHONE HCL PF 1 MG/ML IJ SOLN
INTRAMUSCULAR | Status: AC
Start: 2013-12-15 — End: 2013-12-15
  Administered 2013-12-15: 0.5 mg via INTRAVENOUS
  Filled 2013-12-15: qty 1

## 2013-12-15 MED ORDER — DEXAMETHASONE SODIUM PHOSPHATE 4 MG/ML IJ SOLN
INTRAMUSCULAR | Status: DC | PRN
  Administered 2013-12-15: 8 mg via INTRAVENOUS

## 2013-12-15 MED ORDER — ACETAMINOPHEN 10 MG/ML IV SOLN
INTRAVENOUS | Status: AC
  Filled 2013-12-15: qty 100

## 2013-12-15 MED ORDER — HYDROMORPHONE HCL PF 1 MG/ML IJ SOLN
INTRAMUSCULAR | Status: AC
  Administered 2013-12-15: 0.25 mg via INTRAVENOUS
  Filled 2013-12-15: qty 1

## 2013-12-15 MED ORDER — PROPOFOL 10 MG/ML IV BOLUS
INTRAVENOUS | Status: DC | PRN
Start: 2013-12-15 — End: 2013-12-15
  Administered 2013-12-15: 200 mg via INTRAVENOUS

## 2013-12-15 SURGICAL SUPPLY — 34 items
APPLIER CLIP 5 13 M/L LIGAMAX5 (MISCELLANEOUS) ×3
BANDAGE ADHESIVE 1X3 (GAUZE/BANDAGES/DRESSINGS) ×9 IMPLANT
BENZOIN TINCTURE PRP APPL 2/3 (GAUZE/BANDAGES/DRESSINGS) ×3 IMPLANT
CANISTER SUCTION 2500CC (MISCELLANEOUS) ×3 IMPLANT
CHLORAPREP W/TINT 26ML (MISCELLANEOUS) ×3 IMPLANT
CLIP APPLIE 5 13 M/L LIGAMAX5 (MISCELLANEOUS) ×1 IMPLANT
COVER MAYO STAND STRL (DRAPES) IMPLANT
COVER SURGICAL LIGHT HANDLE (MISCELLANEOUS) ×3 IMPLANT
DECANTER SPIKE VIAL GLASS SM (MISCELLANEOUS) ×3 IMPLANT
DRAPE C-ARM 42X72 X-RAY (DRAPES) IMPLANT
DRAPE UTILITY 15X26 W/TAPE STR (DRAPE) ×6 IMPLANT
ELECT REM PT RETURN 9FT ADLT (ELECTROSURGICAL) ×3
ELECTRODE REM PT RTRN 9FT ADLT (ELECTROSURGICAL) ×1 IMPLANT
GLOVE SURG SIGNA 7.5 PF LTX (GLOVE) ×3 IMPLANT
GOWN STRL REUS W/ TWL LRG LVL3 (GOWN DISPOSABLE) ×3 IMPLANT
GOWN STRL REUS W/ TWL XL LVL3 (GOWN DISPOSABLE) ×1 IMPLANT
GOWN STRL REUS W/TWL LRG LVL3 (GOWN DISPOSABLE) ×6
GOWN STRL REUS W/TWL XL LVL3 (GOWN DISPOSABLE) ×2
KIT BASIN OR (CUSTOM PROCEDURE TRAY) ×3 IMPLANT
KIT ROOM TURNOVER OR (KITS) ×3 IMPLANT
NEEDLE HYPO 25GX1X1/2 BEV (NEEDLE) ×3 IMPLANT
NS IRRIG 1000ML POUR BTL (IV SOLUTION) ×3 IMPLANT
PAD ARMBOARD 7.5X6 YLW CONV (MISCELLANEOUS) ×3 IMPLANT
POUCH SPECIMEN RETRIEVAL 10MM (ENDOMECHANICALS) IMPLANT
SCISSORS LAP 5X35 DISP (ENDOMECHANICALS) ×3 IMPLANT
SET IRRIG TUBING LAPAROSCOPIC (IRRIGATION / IRRIGATOR) ×3 IMPLANT
SLEEVE ENDOPATH XCEL 5M (ENDOMECHANICALS) ×6 IMPLANT
SPECIMEN JAR SMALL (MISCELLANEOUS) ×3 IMPLANT
SUT MON AB 4-0 PC3 18 (SUTURE) ×3 IMPLANT
TOWEL OR 17X24 6PK STRL BLUE (TOWEL DISPOSABLE) ×3 IMPLANT
TOWEL OR 17X26 10 PK STRL BLUE (TOWEL DISPOSABLE) ×3 IMPLANT
TRAY LAPAROSCOPIC (CUSTOM PROCEDURE TRAY) ×3 IMPLANT
TROCAR XCEL BLUNT TIP 100MML (ENDOMECHANICALS) ×3 IMPLANT
TROCAR XCEL NON-BLD 5MMX100MML (ENDOMECHANICALS) ×3 IMPLANT

## 2013-12-15 NOTE — Anesthesia Preprocedure Evaluation (Addendum)
Anesthesia Evaluation  Patient identified by MRN, date of birth, ID band Patient awake    Reviewed: Allergy & Precautions, H&P , NPO status , Patient's Chart, lab work & pertinent test results  History of Anesthesia Complications Negative for: history of anesthetic complications  Airway Mallampati: II TM Distance: >3 FB Neck ROM: Full    Dental  (+) Missing, Dental Advisory Given   Pulmonary former smoker,  breath sounds clear to auscultation        Cardiovascular negative cardio ROS  Rhythm:Regular Rate:Normal     Neuro/Psych negative neurological ROS  negative psych ROS   GI/Hepatic Neg liver ROS, Recent nausea and vomiting.   Endo/Other  negative endocrine ROS  Renal/GU negative Renal ROS     Musculoskeletal   Abdominal   Peds  Hematology   Anesthesia Other Findings   Reproductive/Obstetrics negative OB ROS                          Anesthesia Physical Anesthesia Plan  ASA: I  Anesthesia Plan: General   Post-op Pain Management:    Induction: Intravenous  Airway Management Planned: Oral ETT  Additional Equipment:   Intra-op Plan:   Post-operative Plan: Extubation in OR  Informed Consent: I have reviewed the patients History and Physical, chart, labs and discussed the procedure including the risks, benefits and alternatives for the proposed anesthesia with the patient or authorized representative who has indicated his/her understanding and acceptance.     Plan Discussed with:   Anesthesia Plan Comments:         Anesthesia Quick Evaluation

## 2013-12-15 NOTE — Transfer of Care (Signed)
Immediate Anesthesia Transfer of Care Note  Patient: Katherine [name]  Procedure(s) Performed: Procedure(s): LAPAROSCOPIC CHOLECYSTECTOMY  (N/A)  Patient Location: PACU  Anesthesia Type:General  Level of Consciousness: oriented, sedated and patient cooperative  Airway & Oxygen Therapy: Patient Spontanous Breathing and Patient connected to nasal cannula oxygen  Post-op Assessment: Report given to PACU RN and Post -op Vital signs reviewed and stable  Post vital signs: Reviewed  Complications: No apparent anesthesia complications

## 2013-12-15 NOTE — Progress Notes (Signed)
Patient ID: Katherine [name], female   DOB: [birth year], 48 y.o.   MRN: [id number]  Plan lap chole with possible c-gram today  I discussed the risks with her in detail. I discussed the procedure in detail.   We discussed the risks and benefits of a laparoscopic cholecystectomy and possible cholangiogram including, but not limited to bleeding, infection, injury to surrounding structures such as the intestine or liver, bile leak, retained gallstones, need to convert to an open procedure, prolonged diarrhea, blood clots such as  DVT, common bile duct injury, anesthesia risks, and possible need for additional procedures.  The likelihood of improvement in symptoms and return to the patient's normal status is good. We discussed the typical post-operative recovery course.

## 2013-12-15 NOTE — Anesthesia Postprocedure Evaluation (Signed)
  Anesthesia Post-op Note  Patient: Katherine [name]  Procedure(s) Performed: Procedure(s): LAPAROSCOPIC CHOLECYSTECTOMY  (N/A)  Patient Location: PACU  Anesthesia Type:General  Level of Consciousness: awake, alert , oriented and patient cooperative  Airway and Oxygen Therapy: Patient Spontanous Breathing  Post-op Pain: mild  Post-op Assessment: Post-op Vital signs reviewed, Patient's Cardiovascular Status Stable, Respiratory Function Stable, Patent Airway and No signs of Nausea or vomiting  Post-op Vital Signs: stable  Last Vitals:  Filed Vitals:   12/15/13 1521  BP: 116/70  Pulse: 59  Temp: 36.9 C  Resp: 15    Complications: No apparent anesthesia complications

## 2013-12-15 NOTE — Anesthesia Procedure Notes (Signed)
Procedure Name: Intubation Date/Time: 12/15/2013 12:44 PM Performed by: Jenne Campus Pre-anesthesia Checklist: Patient identified, Emergency Drugs available, Suction available, Patient being monitored and Timeout performed Patient Re-evaluated:Patient Re-evaluated prior to inductionOxygen Delivery Method: Circle system utilized Preoxygenation: Pre-oxygenation with 100% oxygen Intubation Type: IV induction Ventilation: Mask ventilation without difficulty Laryngoscope Size: Miller and 2 Grade View: Grade II Tube type: Oral Tube size: 7.0 mm Number of attempts: 1 Airway Equipment and Method: Stylet Placement Confirmation: ETT inserted through vocal cords under direct vision,  positive ETCO2,  CO2 detector and breath sounds checked- equal and bilateral Secured at: 20 cm Tube secured with: Tape Dental Injury: Teeth and Oropharynx as per pre-operative assessment

## 2013-12-15 NOTE — Op Note (Signed)
Laparoscopic Cholecystectomy Procedure Note  Indications: This patient presents with symptomatic gallbladder disease and will undergo laparoscopic cholecystectomy.  Pre-operative Diagnosis: Calculus of gallbladder with acute cholecystitis, without mention of obstruction  Post-operative Diagnosis: Same  Surgeon: Harl Bowie   Assistants: 0  Anesthesia: General endotracheal anesthesia  ASA Class: 2  Procedure Details  The patient was seen again in the Holding Room. The risks, benefits, complications, treatment options, and expected outcomes were discussed with the patient. The possibilities of reaction to medication, pulmonary aspiration, perforation of viscus, bleeding, recurrent infection, finding a normal gallbladder, the need for additional procedures, failure to diagnose a condition, the possible need to convert to an open procedure, and creating a complication requiring transfusion or operation were discussed with the patient. The likelihood of improving the patient's symptoms with return to their baseline status is good.  The patient and/or family concurred with the proposed plan, giving informed consent. The site of surgery properly noted. The patient was taken to Operating Room, identified as [facility] and the procedure verified as Laparoscopic Cholecystectomy with Intraoperative Cholangiogram. A Time Out was held and the above information confirmed.  Prior to the induction of general anesthesia, antibiotic prophylaxis was administered. General endotracheal anesthesia was then administered and tolerated well. After the induction, the abdomen was prepped with Chloraprep and draped in sterile fashion. The patient was positioned in the supine position.  Local anesthetic agent was injected into the skin near the umbilicus and an incision made. We dissected down to the abdominal fascia with blunt dissection.  The fascia was incised vertically and we entered the peritoneal cavity  bluntly.  A pursestring suture of 0-Vicryl was placed around the fascial opening.  The Hasson cannula was inserted and secured with the stay suture.  Pneumoperitoneum was then created with CO2 and tolerated well without any adverse changes in the patient's vital signs. An 11-mm port was placed in the subxiphoid position.  Two 5-mm ports were placed in the right upper quadrant. All skin incisions were infiltrated with a local anesthetic agent before making the incision and placing the trocars.   We positioned the patient in reverse Trendelenburg, tilted slightly to the patient's left.  The gallbladder was identified, the fundus grasped and retracted cephalad. Adhesions were lysed bluntly and with the electrocautery where indicated, taking care not to injure any adjacent organs or viscus. The infundibulum was grasped and retracted laterally, exposing the peritoneum overlying the triangle of Calot. This was then divided and exposed in a blunt fashion. The cystic duct was clearly identified and bluntly dissected circumferentially. A critical view of the cystic duct and cystic artery was obtained.  The cystic duct was then ligated with clips and divided. The cystic artery was, dissected free, ligated with clips and divided as well.   The gallbladder was dissected from the liver bed in retrograde fashion with the electrocautery. The gallbladder was removed and placed in an Endocatch sac. The liver bed was irrigated and inspected. Hemostasis was achieved with the electrocautery. Copious irrigation was utilized and was repeatedly aspirated until clear.  The gallbladder and Endocatch sac were then removed through the umbilical port site.  The pursestring suture was used to close the umbilical fascia.    We again inspected the right upper quadrant for hemostasis.  Pneumoperitoneum was released as we removed the trocars.  4-0 Monocryl was used to close the skin.   Benzoin, steri-strips, and clean dressings were applied.  The patient was then extubated and brought to the recovery  room in stable condition. Instrument, sponge, and needle counts were correct at closure and at the conclusion of the case.   Findings: Cholecystitis with Cholelithiasis  Estimated Blood Loss: Minimal         Drains: 0         Specimens: Gallbladder           Complications: None; patient tolerated the procedure well.         Disposition: PACU - hemodynamically stable.         Condition: stable

## 2013-12-16 LAB — URINE CULTURE: Colony Count: >=100000

## 2013-12-16 MED ORDER — HYDROCODONE-ACETAMINOPHEN 5-325 MG PO TABS: 1.0000 | ORAL_TABLET | Freq: Four times a day (QID) | ORAL | Status: DC | PRN

## 2013-12-16 NOTE — Discharge Instructions (Signed)
See above

## 2013-12-16 NOTE — Progress Notes (Signed)
Discharge home. Home discharge instruction given to family who understands and speaks english at bedside. No questions verbalized.

## 2013-12-16 NOTE — Discharge Summary (Signed)
  Patient ID: Katherine [name] [id number] 48 y.o. 1966/06/16  Admit date: 12/14/2013  Discharge date and time: No discharge date for patient encounter.  Admitting Physician: Nedra Hai, MD  Discharge Physician: Adin Hector  Admission Diagnoses: Acute cholecystitis cholecystitis  Discharge Diagnoses: same  Operations: Procedure(s): LAPAROSCOPIC CHOLECYSTECTOMY   Admission Condition: fair  Discharged Condition: good  Indication for Admission: Patient is a 48 year old female, Nepalese with her son who translates, who presents with epigastric/right upper quadrant abdominal pain. She states this is gone for approximately 2 weeks. He states that today after having some water patient began having nausea and vomiting. Patient also describes pain in the epigastrium right upper quadrant. Patient underwent ultrasound which reveals large gallstone impacted at the neck of the gallbladder as well as pericholecystic fluid and gallbladder wall thickening.  Of note patient has had previous uterine surgeries and is slated for uterine surgery next week [facility] Course: The patient was admitted late at night placed on IV fluids, antibiotics and pain medication. Yesterday, the patient was taken to the operating room by Dr. Coralie Keens, and she underwent Laparoscopic cholecystectomy. She was observed overnight and did very well. She became ambulatory. She is voiding reasonably well. She is tolerating diet well. She has some pain but is well-controlled. On the day of discharge her abdomen was soft, minimally tender. Not distended wounds look good. . Basically a benign postop exam. I discussed her diet and activities and followup with [place] in detail using her daughter as a Optometrist. All of her questions were answered. She agrees with this plan. She was given a prescription for Norco for pain. She will resume her newer her usual medications.  Consults: None  Significant Diagnostic  Studies: Lab work and ultrasound.  Treatments: surgery: Laparoscopic cholecystectomy  Disposition: Home  Patient Instructions:    Medication List         clarithromycin 500 MG tablet  Commonly known as:  BIAXIN  Take 1 tablet (500 mg total) by mouth 2 (two) times daily.     HYDROcodone-acetaminophen 5-325 MG per tablet  Commonly known as:  NORCO  Take 1-2 tablets by mouth every 6 (six) hours as needed.     HYDROcodone-homatropine 5-1.5 MG/5ML syrup  Commonly known as:  HYCODAN  Take 5 mLs by mouth every 8 (eight) hours as needed for cough.     ibuprofen 800 MG tablet  Commonly known as:  ADVIL,MOTRIN  Take 800 mg by mouth every 8 (eight) hours as needed (pain).     meclizine 25 MG tablet  Commonly known as:  ANTIVERT  Take 1 tablet (25 mg total) by mouth 2 (two) times daily.     promethazine 25 MG tablet  Commonly known as:  PHENERGAN  Take 1 tablet (25 mg total) by mouth every 6 (six) hours as needed for nausea or vomiting.        Activity: no heavy lifting for 2 weeks Diet: low fat, low cholesterol diet Wound Care: none needed  Follow-up:  With [facility], M[facility]D. [facility] surgery in 3 weeks.  Signed: Edsel Petrin. Dalbert Batman, M.D., FACS General and minimally invasive surgery Breast and Colorectal Surgery  12/16/2013, 8:03 AM

## 2013-12-17 ENCOUNTER — Encounter (HOSPITAL_COMMUNITY): Payer: Self-pay | Admitting: Surgery

## 2013-12-19 ENCOUNTER — Telehealth: Payer: Self-pay

## 2013-12-19 NOTE — Telephone Encounter (Signed)
Pt states he would like to discuss his moms health and surgeries with you and her insurance problems and states that he went to DSS this morning and states they need a note from her PCP stating that she cant work right now due to having surgeries and OV. Please advise.

## 2013-12-20 ENCOUNTER — Encounter: Payer: Self-pay | Admitting: General Practice

## 2013-12-23 ENCOUNTER — Encounter: Payer: Self-pay | Admitting: Medical

## 2013-12-23 ENCOUNTER — Ambulatory Visit (INDEPENDENT_AMBULATORY_CARE_PROVIDER_SITE_OTHER): Payer: No Typology Code available for payment source | Admitting: Medical

## 2013-12-23 VITALS — BP 98/60 | HR 68 | Temp 97.2°F | Resp 16 | Wt 110.0 lb

## 2013-12-23 DIAGNOSIS — R0989 Other specified symptoms and signs involving the circulatory and respiratory systems: Secondary | ICD-10-CM

## 2013-12-23 DIAGNOSIS — R0600 Dyspnea, unspecified: Secondary | ICD-10-CM

## 2013-12-23 DIAGNOSIS — N938 Other specified abnormal uterine and vaginal bleeding: Secondary | ICD-10-CM

## 2013-12-23 DIAGNOSIS — R102 Pelvic and perineal pain: Principal | ICD-10-CM

## 2013-12-23 DIAGNOSIS — Z9089 Acquired absence of other organs: Secondary | ICD-10-CM

## 2013-12-23 DIAGNOSIS — Z9049 Acquired absence of other specified parts of digestive tract: Secondary | ICD-10-CM | POA: Insufficient documentation

## 2013-12-23 DIAGNOSIS — R0609 Other forms of dyspnea: Secondary | ICD-10-CM

## 2013-12-23 DIAGNOSIS — R5381 Other malaise: Secondary | ICD-10-CM

## 2013-12-23 DIAGNOSIS — R110 Nausea: Secondary | ICD-10-CM

## 2013-12-23 DIAGNOSIS — N949 Unspecified condition associated with female genital organs and menstrual cycle: Secondary | ICD-10-CM

## 2013-12-23 DIAGNOSIS — R195 Other fecal abnormalities: Secondary | ICD-10-CM

## 2013-12-23 DIAGNOSIS — R531 Weakness: Secondary | ICD-10-CM | POA: Insufficient documentation

## 2013-12-23 DIAGNOSIS — R5383 Other fatigue: Secondary | ICD-10-CM

## 2013-12-23 DIAGNOSIS — G8929 Other chronic pain: Secondary | ICD-10-CM

## 2013-12-23 MED ORDER — HYDROCODONE-ACETAMINOPHEN 5-325 MG PO TABS: 1.0000 | ORAL_TABLET | Freq: Four times a day (QID) | ORAL | Status: DC | PRN

## 2013-12-23 MED ORDER — PROMETHAZINE HCL 25 MG PO TABS
25.0000 mg | ORAL_TABLET | Freq: Four times a day (QID) | ORAL | Status: DC | PRN
Start: 2013-12-23 — End: 2014-01-08

## 2013-12-23 MED ORDER — IBUPROFEN 800 MG PO TABS: 800.0000 mg | ORAL_TABLET | Freq: Three times a day (TID) | ORAL | Status: DC | PRN

## 2013-12-23 NOTE — Progress Notes (Signed)
Subjective:   Katherine [name] is a 48 y.o. female presenting on 12/23/2013 with CONSULT VISIT and THEY SAID SHE HAD SUGERY LAST MONTH FOR GALLBLADDER  Here today with her son who is translating as well as a Optometrist for language resources.  She is here today with chief complaint of needing a letter to take to Department of Social Services.  In recent months she has had ongoing chronic pelvic pain, fairly heavy daily vaginal bleeding.  I have seen her on a couple occasions for this now as well as ongoing fatigue and weakness.  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  However at this point she has been unable to work since 12/04/2013.  At her last gynecology visit she was advised to have a hysterectomy due to the ongoing uterine bleeding, has had 3 prior uterine procedures including ablation and an open procedure for bleeding.  Some of her prior procedures were done back in [place] home country.  She cannot proceed to surgery recently due to financial situation, high co-pay, and unable to meet these obligations.  Thus she continues to suffer through the bleeding and pain and weakness, can't seem to find a surgeon that will work with her on costs and do the procedure/hysterectomy.    In addition to this she went to the emergency department in May for abdominal pain, and ended up having her bladder removed.  Since before the gallbladder surgery she has continued to have back pain, says she gets a little out of breath if her back hurts, however massage seem to relieve this. She has some mild pain at the incision sites as well as itching, and has had a little but of loose stool the last several days.  She denies fever, chest pain, leg edema, vomiting, although she has had some nausea.   Since she has been unable to work, her son who is in college as well as her husband have tried to pay her insurance  premiums, but now they are finding it hard to take care of the premiums.  She has found a different gynecologist that she will be consulting with this week to possibly do the hysterectomy in [facility].  No other aggravating or relieving factors.  No other complaint.  Review of Systems ROS as in subjective      Objective:    Filed Vitals:   12/23/13 1117  BP: 98/60  Pulse: 68  Temp: 97.2 F (36.2 C)  Resp: 16    General appearance: alert, no distress, WD/WN, fatigued appearing Oral cavity: MMM, no lesions Neck: supple, no lymphadenopathy, no thyromegaly, no masses Heart: RRR, normal S1, S2, no murmurs Lungs: CTA bilaterally, no wheezes, rhonchi, or rales Abdomen: +bs, soft, she has bandages overlying the recent surgical incision sites, but none are indurated, warm, and there is slight generalized abdominal tenderness, non distended, no masses, no hepatomegaly, no splenomegaly Pulses: 2+ symmetric, upper and lower extremities, normal cap refill Ext: no edema      Assessment: Encounter Diagnoses  Name Primary?  . Chronic pelvic pain in female Yes  . Dysfunctional uterine bleeding   . S/P cholecystectomy   . Dyspnea   . Generalized weakness   . Loose stools   . Nausea alone      Plan: I am not sure if I understand them  correctly, but it is my understanding they're  going to try for Medicaid.  However there was some mention of social security disability.  I advise I do not make a determination disability, but I would write a letter on her behalf outlining her medical problems.  I advise that ultimately if she can have a hysterectomy, this will probably take care of the other problems where she can return to work within a reasonable amount of time.  It has been challenging for her and her family financially and mentally to get this done, thus, she continues to have pelvic pain and daily uterine bleeding.  Of note on past visits we tried to give them additional resources  regarding finances, she has now went through both [facility] and [facility] financial department to get some help, but has not had any luck. She is also seen at least 2 other gynecologist thinking they're going to help her but only to find out she still can't afford the surgery.   At this point they will follow up with a different gynecologist this week (Dr. Dolores Frame with [facility] in [place], [phone]), and they will keep pushing to get Medicaid coverage  Katherine [name] was seen today for consult visit and they said she had sugery last month for gallbladder.  Diagnoses and associated orders for this visit:  Chronic pelvic pain in female  Dysfunctional uterine bleeding  S/P cholecystectomy  Dyspnea  Generalized weakness  Loose stools  Nausea alone  Other Orders - ibuprofen (ADVIL,MOTRIN) 800 MG tablet; Take 1 tablet (800 mg total) by mouth every 8 (eight) hours as needed (pain). - promethazine (PHENERGAN) 25 MG tablet; Take 1 tablet (25 mg total) by mouth every 6 (six) hours as needed for nausea or vomiting. - HYDROcodone-acetaminophen (NORCO) 5-325 MG per tablet; Take 1-2 tablets by mouth every 6 (six) hours as needed.    Return pending call back.

## 2014-01-06 ENCOUNTER — Telehealth: Payer: Self-pay | Admitting: Internal Medicine

## 2014-01-06 NOTE — Telephone Encounter (Signed)
Faxed over medical records to [facility] @ [phone]

## 2014-01-07 ENCOUNTER — Encounter: Payer: Self-pay | Admitting: General Practice

## 2014-01-08 ENCOUNTER — Ambulatory Visit (INDEPENDENT_AMBULATORY_CARE_PROVIDER_SITE_OTHER): Payer: No Typology Code available for payment source | Admitting: Surgery

## 2014-01-08 ENCOUNTER — Encounter (INDEPENDENT_AMBULATORY_CARE_PROVIDER_SITE_OTHER): Payer: Self-pay | Admitting: Surgery

## 2014-01-08 VITALS — BP 120/81 | HR 62 | Temp 98.4°F | Resp 14 | Ht 60.0 in | Wt 115.4 lb

## 2014-01-08 DIAGNOSIS — Z09 Encounter for follow-up examination after completed treatment for conditions other than malignant neoplasm: Secondary | ICD-10-CM

## 2014-01-08 MED ORDER — HYDROCODONE-ACETAMINOPHEN 5-325 MG PO TABS: 1.0000 | ORAL_TABLET | Freq: Four times a day (QID) | ORAL | Status: DC | PRN

## 2014-01-08 NOTE — Progress Notes (Signed)
Subjective:     Patient ID: Katherine [name], female   DOB: 10-18-1965, 48 y.o.   MRN: [id number]  HPI She is here for postop visit as post laparoscopic cholecystectomy. Other than mild diarrhea, she is doing well. She still has some occasional right upper quadrant discomfort.  Review of Systems     Objective:   Physical Exam On exam, her incisions are healing well. 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 I will see her back as needed

## 2014-02-14 ENCOUNTER — Encounter: Payer: Self-pay | Admitting: General Practice

## 2014-04-01 ENCOUNTER — Ambulatory Visit: Payer: No Typology Code available for payment source | Admitting: Medical

## 2014-04-02 ENCOUNTER — Telehealth: Payer: Self-pay | Admitting: Internal Medicine

## 2014-04-02 NOTE — Telephone Encounter (Signed)
Faxed over medical records to [facility] @ [phone] on 04/01/14

## 2014-04-03 ENCOUNTER — Encounter: Payer: Self-pay | Admitting: Medical

## 2014-04-03 ENCOUNTER — Ambulatory Visit (INDEPENDENT_AMBULATORY_CARE_PROVIDER_SITE_OTHER): Payer: No Typology Code available for payment source | Admitting: Medical

## 2014-04-03 ENCOUNTER — Telehealth: Payer: Self-pay | Admitting: Medical

## 2014-04-03 VITALS — BP 110/70 | HR 78 | Temp 98.2°F | Resp 15 | Wt 118.0 lb

## 2014-04-03 DIAGNOSIS — M25561 Pain in right knee: Secondary | ICD-10-CM

## 2014-04-03 DIAGNOSIS — N949 Unspecified condition associated with female genital organs and menstrual cycle: Secondary | ICD-10-CM

## 2014-04-03 DIAGNOSIS — R2689 Other abnormalities of gait and mobility: Secondary | ICD-10-CM

## 2014-04-03 DIAGNOSIS — G8929 Other chronic pain: Secondary | ICD-10-CM

## 2014-04-03 DIAGNOSIS — M25559 Pain in unspecified hip: Secondary | ICD-10-CM

## 2014-04-03 DIAGNOSIS — M25539 Pain in unspecified wrist: Secondary | ICD-10-CM

## 2014-04-03 DIAGNOSIS — M25551 Pain in right hip: Secondary | ICD-10-CM

## 2014-04-03 DIAGNOSIS — M25569 Pain in unspecified knee: Secondary | ICD-10-CM

## 2014-04-03 DIAGNOSIS — R269 Unspecified abnormalities of gait and mobility: Secondary | ICD-10-CM

## 2014-04-03 DIAGNOSIS — M25531 Pain in right wrist: Secondary | ICD-10-CM

## 2014-04-03 DIAGNOSIS — R102 Pelvic and perineal pain: Secondary | ICD-10-CM

## 2014-04-03 MED ORDER — NAPROXEN 375 MG PO TABS: 375.0000 mg | ORAL_TABLET | Freq: Two times a day (BID) | ORAL | Status: DC

## 2014-04-03 NOTE — Telephone Encounter (Signed)
See disability records request form.  They are requesting copies of her medical records.

## 2014-04-03 NOTE — Progress Notes (Signed)
Subjective: Here for multiple concerns.  Here with [place] interpreter.     Since last visit she ended up having hysterectomy at [facility] for chronic pelvic pain and heavy vaginal bleeding.  In May had cholecystectomy.  Doing ok from this.   Still having issues with the right wrist.   She was referred to hand surgery last year for abnormality, had MRI showing a surgical issue in the right wrist including multiple cysts.  They were requiring $3,500 up front which she couldn't afford.  Thus, she hasn't yet had surgery, not taking anything for the pain although she has constant pain in the wrist, and due to current medical bills and financial situation, doesn't want to pursue second ortho referral at this time.  Here with form to review for disability.  Its not clear what she went seeking, medicaid insurance vs medicaid/disability, but she has a form today from [place] [employer] for records request for disability.  Has questions about this.   She reports pain in right flank.  She notes pain in right hip or abdomen RLQ but can't specify location, but relates this to hip.  This pain goes down the front of thigh and medial thigh.  No posterior right thigh pain or buttock pain.  No back pain.   She is not working currently due to wrist and leg pains, prior pelvic pain and bleeding was also limiting her ability to work.  Is a refugee from [place].   ROS as in subjective  Objective: BP 110/70  Pulse 78  Temp(Src) 98.2 F (36.8 C) (Oral)  Resp 15  Wt 118 lb (53.524 kg)  General appearance: alert, no distress, WD/WN, lean Nepali female Neck: supple, no lymphadenopathy, no thyromegaly, no masses Heart: RRR, normal S1, S2, no murmurs Lungs: CTA bilaterally, no wheezes, rhonchi, or rales Abdomen: +bs, soft, RUQ and umbilical and suprapubic surgical scars, tender lower abdomen throughout,  non distended, no masses, no hepatomegaly, no splenomegaly Back nontender, no pain with ROM which  is relatively full Neuro: DTRs seem normal bilat, unable to adequately check right leg strength due to pain, sensation of right foot is decreased on sole of foot throughout, unable to do heel and toe walk of right leg, left leg neuro intact MSK: tender along right superior anterior hip region, tender along anterior thigh generalized, pain with hip ROM in general which is limited due to pain, she is walking with a limp, guarded with right leg.  No lateral hip tenderness, no trochanteric tenderness, rest of leg nontender and no deformity.   Left leg exam unremarkable Pulses: 2+ symmetric, upper and lower extremities, normal cap refill   Assessment: Encounter Diagnoses  Name Primary?  . Hip pain, right Yes  . Pain in joint, lower leg, right   . Antalgic gait   . Chronic pelvic pain in female   . Chronic pain of right wrist    Plan: Hip pain, pain in leg, antalgic gait - abnormal right hip exam.  Advised xray but she refuses due to financial reasons.   I advise that without some type of imaging, I can't really make a diagnosis and she obviously has something going on with the hip or leg.  Can use naprosyn prn up to BID, but advised she needs xray, and if worsening, recheck here, or possibly may need to go to ED as we are limited without imaging on this issue.  Chronic pelvic pain - still tender on exam, but improved since  01/2014 hysterectomy.  We will request records from [facility].  Chronic wrist pain - reviewed hand surgery notes and MRI from this past year.  She declines referral to different orthopedist at this time .  Advised that all I can do is provide NSAID or other pain control for now, but ultimately needs surgery.    Regarding her forms from disability dept, advised that we don't make a determination of disability, but we will forward her records per form instructions.

## 2014-04-10 ENCOUNTER — Encounter: Payer: Self-pay | Admitting: Medical

## 2014-05-14 ENCOUNTER — Encounter (HOSPITAL_COMMUNITY): Payer: Self-pay | Admitting: Emergency Medicine

## 2014-05-14 ENCOUNTER — Emergency Department (HOSPITAL_COMMUNITY)
Admission: EM | Admit: 2014-05-14 | Discharge: 2014-05-14 | Disposition: A | Discharge status: Home / Self Care | Payer: No Typology Code available for payment source | Attending: Emergency Medicine | Admitting: Emergency Medicine

## 2014-05-14 DIAGNOSIS — Z8742 Personal history of other diseases of the female genital tract: Secondary | ICD-10-CM | POA: Insufficient documentation

## 2014-05-14 DIAGNOSIS — Z87891 Personal history of nicotine dependence: Secondary | ICD-10-CM | POA: Insufficient documentation

## 2014-05-14 DIAGNOSIS — R300 Dysuria: Secondary | ICD-10-CM | POA: Insufficient documentation

## 2014-05-14 DIAGNOSIS — K529 Noninfective gastroenteritis and colitis, unspecified: Secondary | ICD-10-CM | POA: Insufficient documentation

## 2014-05-14 DIAGNOSIS — R000 Tachycardia, unspecified: Secondary | ICD-10-CM | POA: Insufficient documentation

## 2014-05-14 LAB — URINALYSIS, ROUTINE W REFLEX MICROSCOPIC
BILIRUBIN URINE: NEGATIVE
GLUCOSE, UA: NEGATIVE mg/dL
Ketones, ur: 40 mg/dL — AB
Leukocytes, UA: NEGATIVE
Nitrite: NEGATIVE
PROTEIN: NEGATIVE mg/dL
Specific Gravity, Urine: 1.01 (ref 1.005–1.030)
Urobilinogen, UA: 0.2 mg/dL (ref 0.0–1.0)
pH: 5.5 (ref 5.0–8.0)

## 2014-05-14 LAB — COMPREHENSIVE METABOLIC PANEL
ALT: 54 U/L — AB (ref 0–35)
AST: 38 U/L — ABNORMAL HIGH (ref 0–37)
Albumin: 4 g/dL (ref 3.5–5.2)
Alkaline Phosphatase: 108 U/L (ref 39–117)
Anion gap: 15 (ref 5–15)
BILIRUBIN TOTAL: 1.3 mg/dL — AB (ref 0.3–1.2)
BUN: 8 mg/dL (ref 6–23)
CO2: 20 meq/L (ref 19–32)
Calcium: 10.2 mg/dL (ref 8.4–10.5)
Chloride: 104 mEq/L (ref 96–112)
Creatinine, Ser: 0.62 mg/dL (ref 0.50–1.10)
GFR calc Af Amer: >90 mL/min (ref >90)
Glucose, Bld: 97 mg/dL (ref 70–99)
Potassium: 3.5 mEq/L — ABNORMAL LOW (ref 3.7–5.3)
SODIUM: 139 meq/L (ref 137–147)
Total Protein: 7.6 g/dL (ref 6.0–8.3)

## 2014-05-14 LAB — CBC WITH DIFFERENTIAL/PLATELET
Basophils Absolute: 0 10*3/uL (ref 0.0–0.1)
Basophils Relative: 0 % (ref 0–1)
Eosinophils Absolute: 0 10*3/uL (ref 0.0–0.7)
Eosinophils Relative: 0 % (ref 0–5)
HCT: 36.9 % (ref 36.0–46.0)
Hemoglobin: 12.8 g/dL (ref 12.0–15.0)
LYMPHS ABS: 1.2 10*3/uL (ref 0.7–4.0)
LYMPHS PCT: 8 % — AB (ref 12–46)
MCH: 29.6 pg (ref 26.0–34.0)
MCHC: 34.7 g/dL (ref 30.0–36.0)
MCV: 85.4 fL (ref 78.0–100.0)
MONOS PCT: 2 % — AB (ref 3–12)
Monocytes Absolute: 0.3 10*3/uL (ref 0.1–1.0)
NEUTROS PCT: 90 % — AB (ref 43–77)
Neutro Abs: 12.6 10*3/uL — ABNORMAL HIGH (ref 1.7–7.7)
PLATELETS: 150 10*3/uL (ref 150–400)
RBC: 4.32 MIL/uL (ref 3.87–5.11)
RDW: 12.9 % (ref 11.5–15.5)
WBC: 14.1 10*3/uL — AB (ref 4.0–10.5)

## 2014-05-14 LAB — URINE MICROSCOPIC-ADD ON

## 2014-05-14 LAB — LIPASE, BLOOD: Lipase: 18 U/L (ref 11–59)

## 2014-05-14 MED ORDER — SODIUM CHLORIDE 0.9 % IV BOLUS (SEPSIS)
1000.0000 mL | Freq: Once | INTRAVENOUS | Status: AC
  Administered 2014-05-14: 1000 mL via INTRAVENOUS

## 2014-05-14 MED ORDER — PROMETHAZINE HCL 25 MG/ML IJ SOLN
12.5000 mg | Freq: Once | INTRAMUSCULAR | Status: AC
  Administered 2014-05-14: 12.5 mg via INTRAVENOUS
  Filled 2014-05-14: qty 1

## 2014-05-14 MED ORDER — LOPERAMIDE HCL 2 MG PO CAPS
2.0000 mg | ORAL_CAPSULE | Freq: Once | ORAL | Status: AC
  Administered 2014-05-14: 2 mg via ORAL
  Filled 2014-05-14: qty 1

## 2014-05-14 MED ORDER — LOPERAMIDE HCL 2 MG PO CAPS: 2.0000 mg | ORAL_CAPSULE | Freq: Four times a day (QID) | ORAL | Status: AC | PRN

## 2014-05-14 MED ORDER — ACETAMINOPHEN 325 MG PO TABS
650.0000 mg | ORAL_TABLET | Freq: Once | ORAL | Status: AC
  Administered 2014-05-14: 650 mg via ORAL
  Filled 2014-05-14: qty 2

## 2014-05-14 MED ORDER — FAMOTIDINE IN NACL 20-0.9 MG/50ML-% IV SOLN
20.0000 mg | Freq: Once | INTRAVENOUS | Status: AC
  Administered 2014-05-14: 20 mg via INTRAVENOUS
  Filled 2014-05-14: qty 50

## 2014-05-14 MED ORDER — ONDANSETRON HCL 4 MG/2ML IJ SOLN: 4.0000 mg | Freq: Once | INTRAMUSCULAR | Status: DC

## 2014-05-14 MED ORDER — MORPHINE SULFATE 4 MG/ML IJ SOLN
4.0000 mg | Freq: Once | INTRAMUSCULAR | Status: AC
  Administered 2014-05-14: 4 mg via INTRAVENOUS
  Filled 2014-05-14: qty 1

## 2014-05-14 NOTE — ED Notes (Signed)
Patient sipped some water but refusing to drink more. sts "i get diarrhea with drinking". RN notified MD

## 2014-05-14 NOTE — ED Notes (Signed)
Pt reports generalized abd pain since 0600. Having headache, n/v/d. No acute distress noted at triage.

## 2014-05-14 NOTE — ED Provider Notes (Addendum)
CSN: [id number]     Arrival date & time 05/14/14  1726 History   First MD Initiated Contact with Patient 05/14/14 1954     Chief Complaint  Patient presents with  . Headache  . Emesis  . Diarrhea     (Consider location/radiation/quality/duration/timing/severity/associated sxs/prior Treatment) Patient is a 48 y.o. female presenting with vomiting and diarrhea. The history is provided by the patient.  Emesis Severity:  Moderate Duration: Woke up at 6 AM this morning and had persistent vomiting for 3 hours and resolved at 9 AM. Timing:  Constant Quality:  Stomach contents Progression:  Resolved Chronicity:  New Relieved by:  None tried Ineffective treatments:  None tried Associated symptoms: abdominal pain, chills, diarrhea, fever and headaches   Associated symptoms: no cough, no myalgias and no URI   Abdominal pain:    Location:  Periumbilical   Quality:  Aching, gnawing and fullness   Severity:  Moderate   Onset quality:  Gradual   Timing:  Constant Diarrhea:    Quality:  Watery   Number of occurrences:  Numerous   Severity:  Severe   Timing:  Constant Risk factors: prior abdominal surgery   Risk factors: no alcohol use, no diabetes, no suspect food intake and no travel to endemic areas   Risk factors comment:  Hysterectomy and cholecystectomy this year Diarrhea Associated symptoms: abdominal pain, chills, headaches and vomiting   Associated symptoms: no recent cough, no myalgias and no URI     Past Medical History  Diagnosis Date  . Wears glasses   . Pelvic pain   . DUB (dysfunctional uterine bleeding)    Past Surgical History  Procedure Laterality Date  . Abdominal surgery    . Dilation and curettage of uterus    . Endometrial ablation  2010    same surgery 3 times   . Tubal ligation    . Cholecystectomy N/A 12/15/2013    Procedure: LAPAROSCOPIC CHOLECYSTECTOMY ;  Surgeon: Harl Bowie, MD;  Location: [facility];  Service: General;  Laterality: N/A;    History reviewed. No pertinent family history. History  Substance Use Topics  . Smoking status: Former Research scientist (life sciences)  . Smokeless tobacco: Current User  . Alcohol Use: No   OB History   Grav Para Term Preterm Abortions TAB SAB Ect Mult Living   5 5 5       3      Review of Systems  Constitutional: Positive for chills.  Gastrointestinal: Positive for vomiting, abdominal pain and diarrhea.  Genitourinary: Positive for dysuria.  Musculoskeletal: Negative for myalgias.  Neurological: Positive for headaches.  All other systems reviewed and are negative.     Allergies  Zofran  Home Medications   Prior to Admission medications   Medication Sig Start Date End Date Taking? Authorizing Provider  ibuprofen (ADVIL,MOTRIN) 200 MG tablet Take 400 mg by mouth every 6 (six) hours as needed for moderate pain.   Yes Historical Provider, MD   BP 122/79  Pulse 98  Temp(Src) 98.5 F (36.9 C)  Resp 20  Wt 117 lb (53.071 kg)  SpO2 100% Physical Exam  Nursing note and vitals reviewed. Constitutional: She is oriented to person, place, and time. She appears well-developed and well-nourished. No distress.  HENT:  Head: Normocephalic and atraumatic.  Mouth/Throat: Oropharynx is clear and moist.  Eyes: Conjunctivae and EOM are normal. Pupils are equal, round, and reactive to light.  Neck: Normal range of motion. Neck supple.  Cardiovascular: Regular rhythm and intact distal pulses.  Tachycardia present.   No murmur heard. Pulmonary/Chest: Effort normal and breath sounds normal. No respiratory distress. She has no wheezes. She has no rales.  Abdominal: Soft. Normal appearance. She exhibits no distension. There is tenderness in the periumbilical area. There is no rebound and no guarding.  Well-healed lower abdominal incision  Musculoskeletal: Normal range of motion. She exhibits no edema and no tenderness.  Neurological: She is alert and oriented to person, place, and time.  Skin: Skin is warm and  dry. No rash noted. No erythema.  Psychiatric: She has a normal mood and affect. Her behavior is normal.    ED Course  Procedures (including critical care time) Labs Review Labs Reviewed  CBC WITH DIFFERENTIAL - Abnormal; Notable for the following:    WBC 14.1 (*)    Neutrophils Relative % 90 (*)    Neutro Abs 12.6 (*)    Lymphocytes Relative 8 (*)    Monocytes Relative 2 (*)    All other components within normal limits  COMPREHENSIVE METABOLIC PANEL - Abnormal; Notable for the following:    Potassium 3.5 (*)    AST 38 (*)    ALT 54 (*)    Total Bilirubin 1.3 (*)    All other components within normal limits  URINALYSIS, ROUTINE W REFLEX MICROSCOPIC - Abnormal; Notable for the following:    Hgb urine dipstick SMALL (*)    Ketones, ur 40 (*)    All other components within normal limits  URINE MICROSCOPIC-ADD ON - Abnormal; Notable for the following:    Squamous Epithelial / LPF FEW (*)    All other components within normal limits  LIPASE, BLOOD    Imaging Review No results found.   EKG Interpretation None      MDM   Final diagnoses:  Gastroenteritis    Pt with symptoms most consistent with a viral process with fever/vomitting/diarrhea that started today.  Denies bad food exposure and recent travel out of the country.  No recent abx and last surgery was in july.  No hx concerning for GU pathology or kidney stones however does state she has some dysuria.  Pt is awake and alert on exam without peritoneal signs.  Pain generalized in the periumbilical region.    Will give IV fluids, pain and nausea medication.  10:32 PM Pt feeling better after meds.  Will po challenge  11:04 PM Pt tolerating po's.  Will d/c home and can use immodium for diarrhea.  Blanchie Dessert, MD 05/14/14 [id number]  Blanchie Dessert, MD 05/14/14 [phone]

## 2014-05-14 NOTE — ED Notes (Signed)
The patient is unable at this time to give an urine specimen. The tech reported to the RN in charge.

## 2014-05-24 ENCOUNTER — Telehealth: Payer: Self-pay | Admitting: Internal Medicine

## 2014-05-24 NOTE — Telephone Encounter (Signed)
Faxed medical records to [facility] on 05/09/14

## 2014-05-26 ENCOUNTER — Encounter (HOSPITAL_COMMUNITY): Payer: Self-pay | Admitting: Emergency Medicine

## 2014-08-22 IMAGING — CT CT ABD-PELV W/ CM
2 of 5 series · 16 of 46 positions shown, 18 images · IV contrast (omnipaque)
Comparison: US TRANSVAGINAL NON-OB dated 09/22/2013; CT ABD/PELVIS W
CM dated 07/23/2010

CLINICAL DATA: Abdominal distention.  Hematuria.

EXAM:
CT ABDOMEN AND PELVIS WITH CONTRAST
TECHNIQUE: Multidetector CT imaging of the abdomen and pelvis was performed
using the standard protocol following bolus administration of
intravenous contrast.
CONTRAST:  100mL OMNIPAQUE IOHEXOL 300 MG/ML  SOLN

[Series 2: abd/ pelvis 5.0 i30f 1 · axial · 0.70mm/px · z∈[-1075,-660]mm · 13 of 93 slices shown, 15 images]
[im 5/93  soft-tissue]
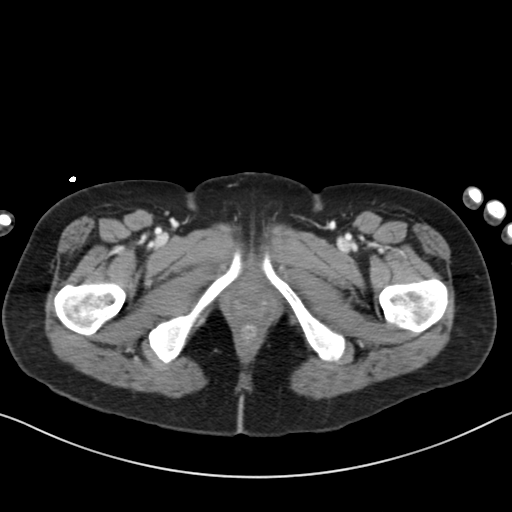
[im 5/93  bone]
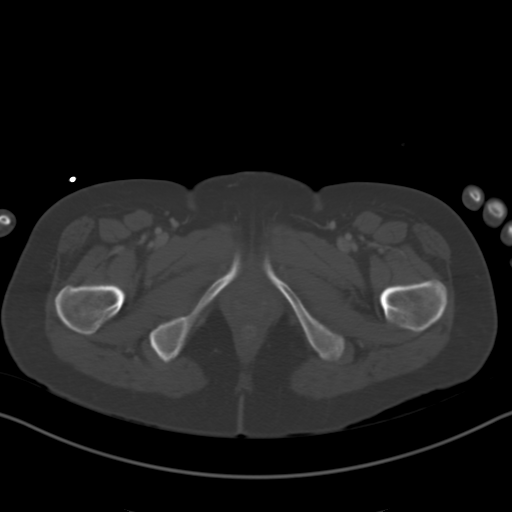
[im 14/93  soft-tissue]
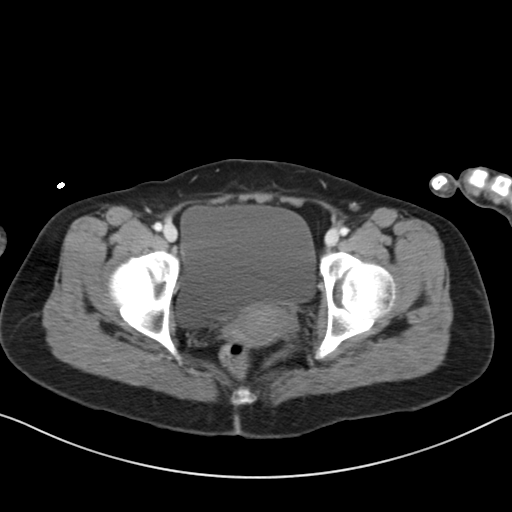
[im 18/93  soft-tissue]
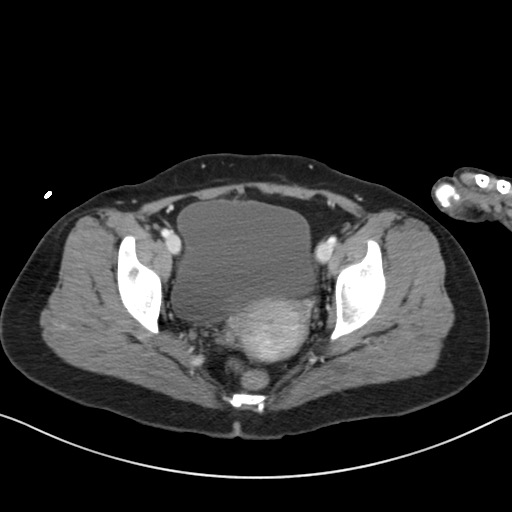
[im 27/93  soft-tissue]
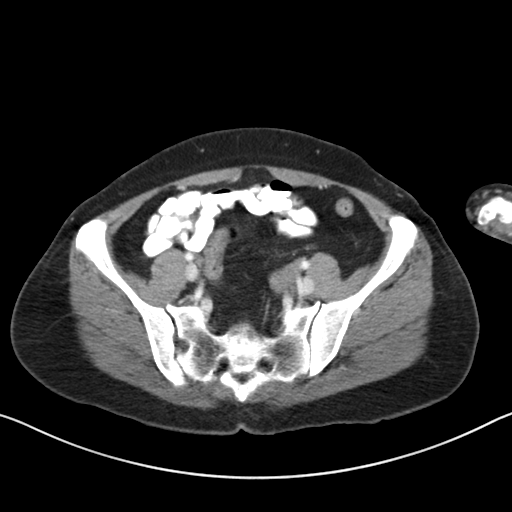
[im 31/93  soft-tissue]
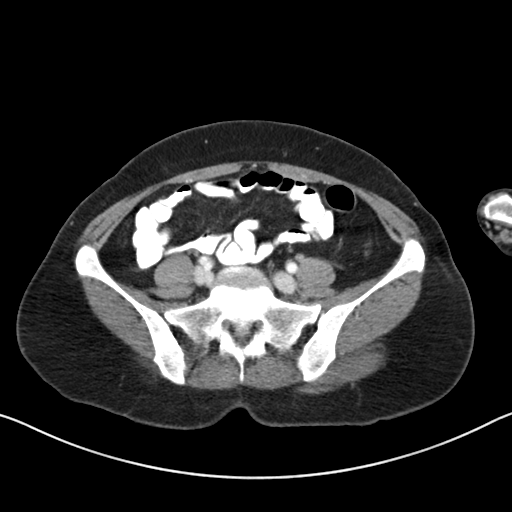
[im 40/93  soft-tissue]
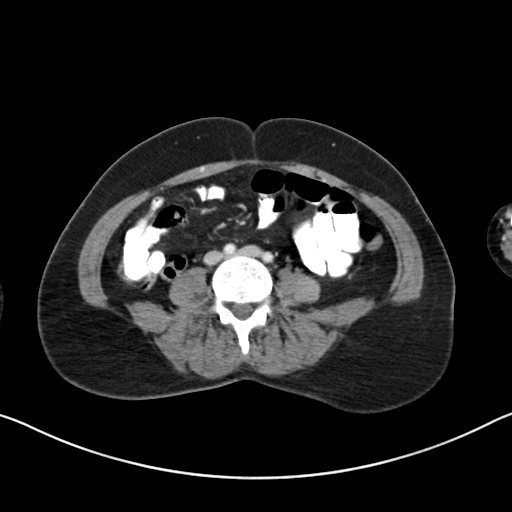
[im 49/93  soft-tissue]
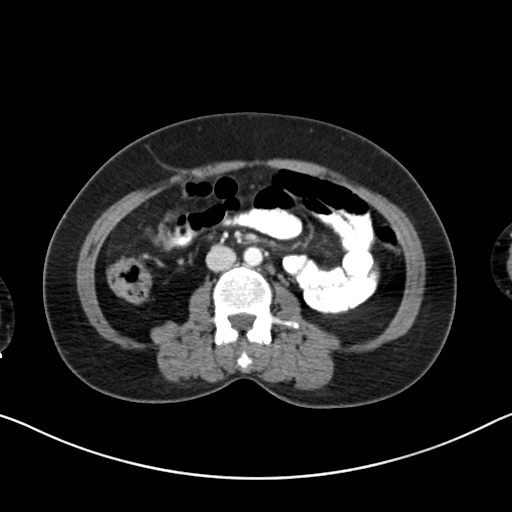
[im 53/93  soft-tissue]
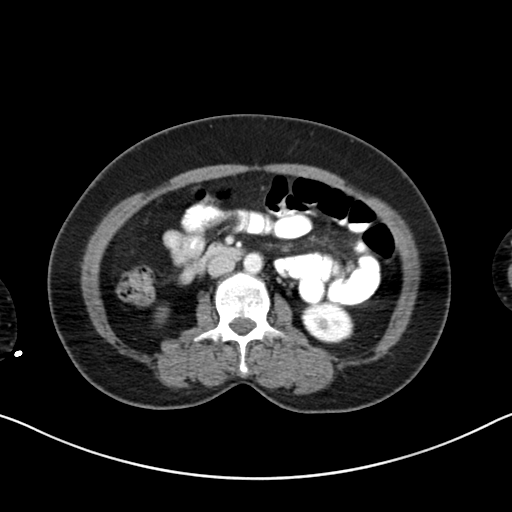
[im 62/93  soft-tissue]
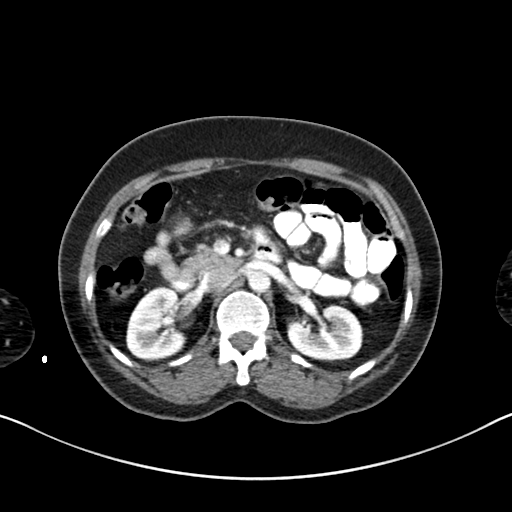
[im 62/93  bone]
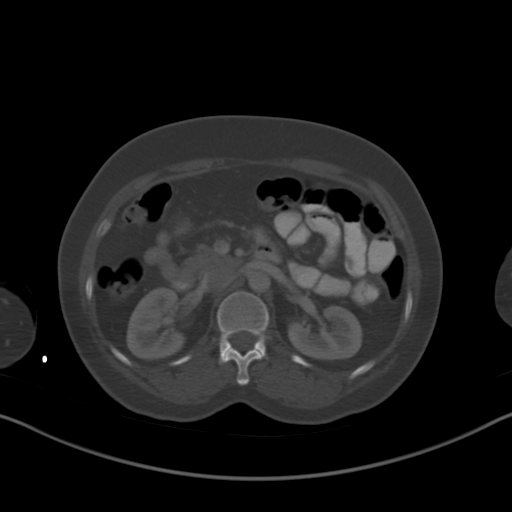
[im 66/93  soft-tissue]
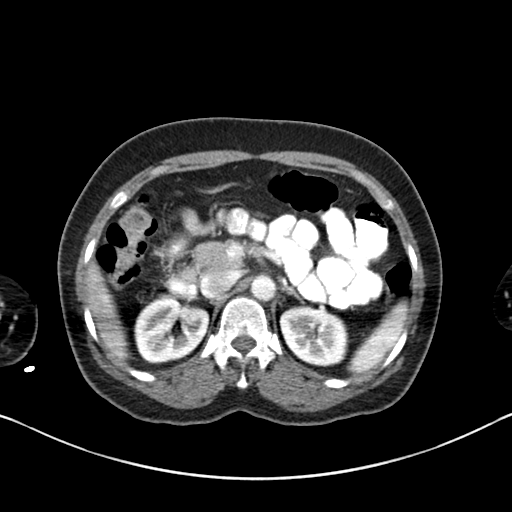
[im 75/93  soft-tissue]
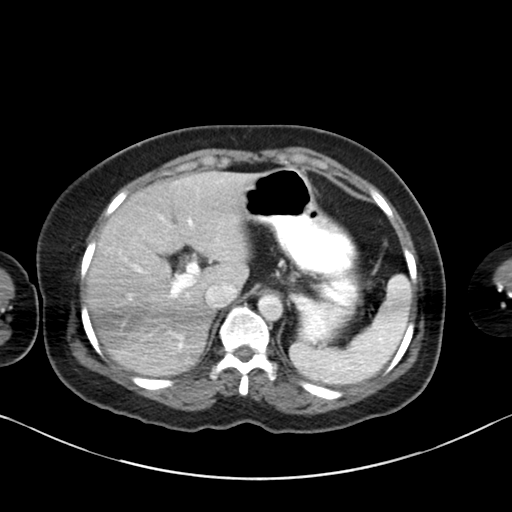
[im 79/93  soft-tissue]
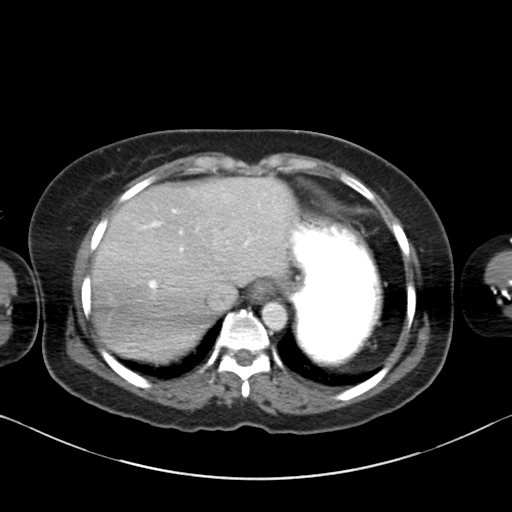
[im 88/93  soft-tissue]
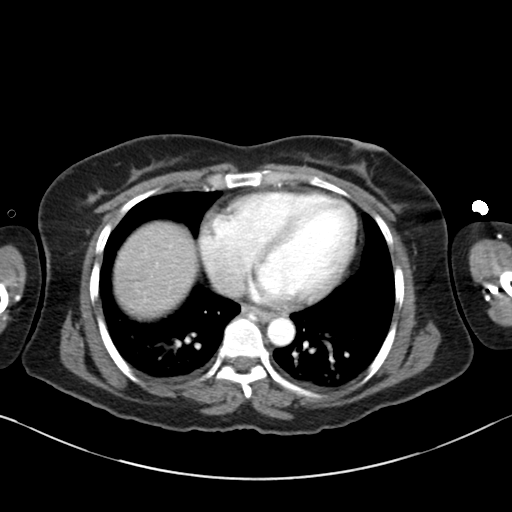

[Series 4: coronals · coronal · 0.70mm/px · 3 of 101 slices shown]
[im 34/101  soft-tissue]
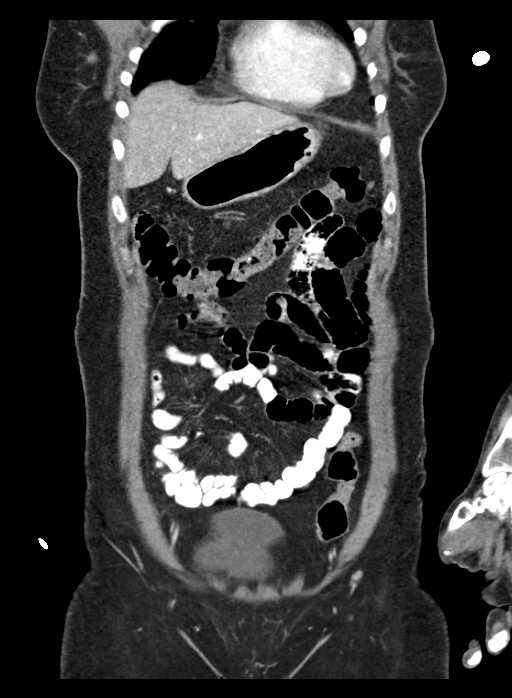
[im 45/101  soft-tissue]
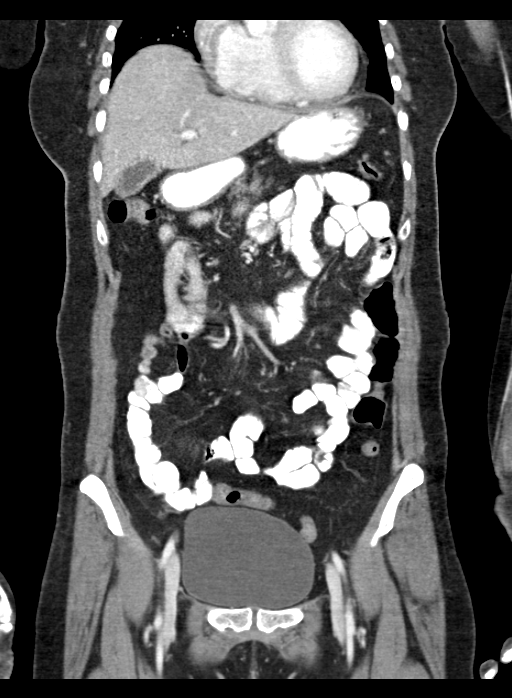
[im 56/101  soft-tissue]
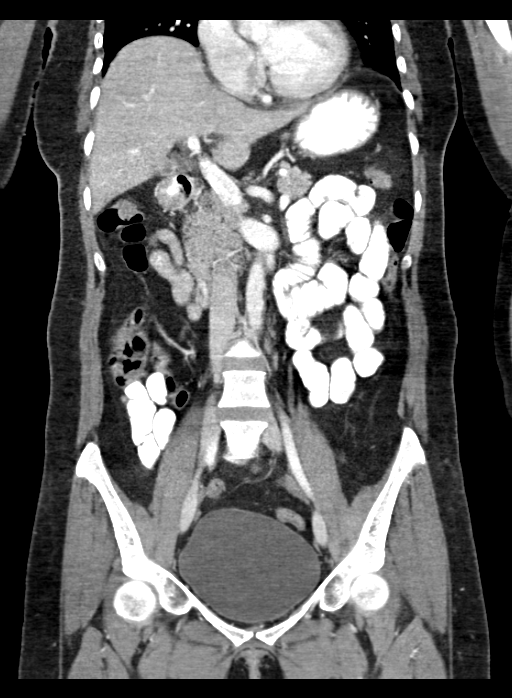

[16 of 46 positions shown; findings below may reference images not displayed]

FINDINGS: Bones:  No aggressive osseous lesion.  No fracture.

Lung Bases: Dependent atelectasis.

Liver: Normal allowing for artifact from the patient's arms being at
the side.

Spleen:  Normal.

Gallbladder:  Normal.

Common bile duct:  Normal.

Pancreas:  Normal.

Adrenal glands:  Normal bilaterally.

Kidneys: Normal enhancement. Normal delayed excretion of contrast.
Both ureters appear normal.

Stomach:  Normal.

Small bowel: Normal. No mesenteric adenopathy or inflammatory
changes. No obstruction.

Colon:   Normal appendix.  Distal colon decompressed.

Pelvic Genitourinary: Physiologic appearance of the uterus and
adnexa. Distended urinary bladder. No free fluid.

Vasculature: Normal.

Body Wall: Normal.
IMPRESSION: No acute abnormality.

## 2014-09-24 ENCOUNTER — Telehealth: Payer: Self-pay | Admitting: Internal Medicine

## 2014-09-24 NOTE — Telephone Encounter (Signed)
Faxed over medical records to [facility] @ [phone] and also faxed over medical records to [facility] [phone].

## 2014-10-28 ENCOUNTER — Telehealth: Payer: Self-pay | Admitting: Internal Medicine

## 2014-10-28 NOTE — Telephone Encounter (Signed)
Faxed medical records to [facility] @ [phone] on September 23, 2014 and March 11,2016.

## 2014-12-16 DIAGNOSIS — Z0279 Encounter for issue of other medical certificate: Secondary | ICD-10-CM
